# Patient Record
Sex: Female | Born: 1960 | Hispanic: No | Marital: Married | Smoking: Never smoker
Health system: Southern US, Community
[De-identification: ages and names within clinical notes are randomized; demographics above are authoritative.]

## PROBLEM LIST (undated history)

## (undated) DIAGNOSIS — M1990 Unspecified osteoarthritis, unspecified site: Secondary | ICD-10-CM

## (undated) HISTORY — PX: KNEE SURGERY: SHX244

## (undated) HISTORY — DX: Unspecified osteoarthritis, unspecified site: M19.90

---

## 2005-12-26 ENCOUNTER — Ambulatory Visit: Payer: Self-pay | Admitting: General Practice

## 2007-01-01 ENCOUNTER — Ambulatory Visit: Payer: Self-pay | Admitting: General Practice

## 2008-03-12 HISTORY — PX: OTHER SURGICAL HISTORY: SHX169

## 2011-12-18 ENCOUNTER — Ambulatory Visit (INDEPENDENT_AMBULATORY_CARE_PROVIDER_SITE_OTHER): Payer: BC Managed Care – PPO | Admitting: Physician Assistant

## 2011-12-18 VITALS — BP 138/75 | HR 59 | Temp 98.5°F | Resp 16 | Ht 66.0 in | Wt 143.0 lb

## 2011-12-18 DIAGNOSIS — Z124 Encounter for screening for malignant neoplasm of cervix: Secondary | ICD-10-CM

## 2011-12-18 DIAGNOSIS — Z1239 Encounter for other screening for malignant neoplasm of breast: Secondary | ICD-10-CM

## 2011-12-18 DIAGNOSIS — Z0000 Encounter for general adult medical examination without abnormal findings: Secondary | ICD-10-CM

## 2011-12-18 LAB — POCT URINALYSIS DIPSTICK
Bilirubin, UA: NEGATIVE
Glucose, UA: NEGATIVE
Spec Grav, UA: 1.015
Urobilinogen, UA: 0.2

## 2011-12-18 LAB — POCT CBC
Granulocyte percent: 54.4 %G (ref 37–80)
HCT, POC: 41.1 % (ref 37.7–47.9)
Hemoglobin: 13 g/dL (ref 12.2–16.2)
MCV: 96.3 fL (ref 80–97)
MID (cbc): 0.7 (ref 0–0.9)
Platelet Count, POC: 306 10*3/uL (ref 142–424)
RBC: 4.27 M/uL (ref 4.04–5.48)

## 2011-12-18 NOTE — Progress Notes (Signed)
Subjective:    Patient ID: Patty Martinez, female    DOB: [birth year], 51 y.o.   MRN: [id number]  HPI  Patty Martinez is a 51 yr old female here for CPE/pap/pelvic.  She speaks limited english, and her son is helping to translate.  She has residual shoulder pain from an injury several years ago, but other than that no specific complaints today.  She would like routine lab work, a pap test, and referral for mammogram as her insurance will be lapsing this month.  She has been on worker's comp from her shoulder injury but will be laid off this month because they are unable to find her a new position.  She had an eye doctor appointment last week and had her dental check-up a couple months ago.  Her last pap was 2-3 years ago, so she would like to do this today.  She has no concern for STIs and does not want to be tested today.  She has not had a flu shot and does not want one.     Review of Systems  Constitutional: Negative for fever and chills.  HENT: Negative.   Eyes: Negative.   Respiratory: Negative.   Cardiovascular: Negative.   Gastrointestinal: Negative.   Genitourinary: Negative.   Musculoskeletal: Positive for arthralgias (Right shoulder).  Neurological: Positive for dizziness (Sometimes with sinus pain). Negative for syncope, light-headedness and headaches.  Hematological: Does not bruise/bleed easily.       Objective:   Physical Exam  Constitutional: She is oriented to person, place, and time. She appears well-developed and well-nourished. No distress.  HENT:  Right Ear: Hearing, tympanic membrane, external ear and ear canal normal.  Left Ear: Hearing, tympanic membrane, external ear and ear canal normal.  Nose: Nose normal.  Mouth/Throat: Uvula is midline, oropharynx is clear and moist and mucous membranes are normal.  Eyes: Conjunctivae normal are normal. Pupils are equal, round, and reactive to light.  Neck: Neck supple. No thyromegaly present.  Cardiovascular: Normal rate,  regular rhythm, normal heart sounds and intact distal pulses.  Exam reveals no gallop and no friction rub.   No murmur heard. Pulmonary/Chest: Breath sounds normal. She has no wheezes. She has no rales.  Abdominal: Soft. Bowel sounds are normal. There is no tenderness. There is no rebound and no guarding.  Genitourinary: Vagina normal and uterus normal. No vaginal discharge found.       No cervical motion tenderness or adnexal tenderness, no adnexal masses; breast exam normal  Lymphadenopathy:    She has no cervical adenopathy.  Neurological: She is alert and oriented to person, place, and time. She has normal reflexes.  Skin: Skin is warm and dry.  Psychiatric: She has a normal mood and affect.    Results for orders placed in visit on 12/18/11  POCT CBC      Component Value Range   WBC 8.0  4.6 - 10.2 K/uL   Lymph, poc 2.9  0.6 - 3.4   POC LYMPH PERCENT 36.5  10 - 50 %L   MID (cbc) 0.7  0 - 0.9   POC MID % 9.1  0 - 12 %M   POC Granulocyte 4.4  2 - 6.9   Granulocyte percent 54.4  37 - 80 %G   RBC 4.27  4.04 - 5.48 M/uL   Hemoglobin 13.0  12.2 - 16.2 g/dL   HCT, POC 29.5  62.1 - 47.9 %   MCV 96.3  80 - 97 fL   MCH, POC 30.4  27 - 31.2 pg   MCHC 31.6 (*) 31.8 - 35.4 g/dL   RDW, POC 16.1     Platelet Count, POC 306  142 - 424 K/uL   MPV 8.0  0 - 99.8 fL  POCT URINALYSIS DIPSTICK      Component Value Range   Color, UA yellow     Clarity, UA clear     Glucose, UA neg     Bilirubin, UA neg     Ketones, UA neg     Spec Grav, UA 1.015     Blood, UA small     pH, UA 7.0     Protein, UA neg     Urobilinogen, UA 0.2     Nitrite, UA neg     Leukocytes, UA Negative           Assessment & Plan:  Patty Martinez is a 51 yr old female here for CPE, pap, and pelvic.  She is well appearing and has no complaints.  CBC and UA are normal.   CMET, TSH, lipids, and pap are pending.  Made referral for screening mammogram.  She will return to clinic as needed

## 2011-12-19 LAB — LIPID PANEL
Cholesterol: 237 mg/dL — ABNORMAL HIGH (ref 0–200)
LDL Cholesterol: 162 mg/dL — ABNORMAL HIGH (ref 0–99)
VLDL: 19 mg/dL (ref 0–40)

## 2011-12-19 LAB — COMPREHENSIVE METABOLIC PANEL
AST: 17 U/L (ref 0–37)
Albumin: 4.6 g/dL (ref 3.5–5.2)
Alkaline Phosphatase: 36 U/L — ABNORMAL LOW (ref 39–117)
Glucose, Bld: 86 mg/dL (ref 70–99)
Potassium: 4 mEq/L (ref 3.5–5.3)
Sodium: 139 mEq/L (ref 135–145)
Total Protein: 7.6 g/dL (ref 6.0–8.3)

## 2011-12-19 NOTE — Progress Notes (Signed)
Reviewed and agree.

## 2011-12-21 ENCOUNTER — Ambulatory Visit
Admission: RE | Admit: 2011-12-21 | Discharge: 2011-12-21 | Disposition: A | Discharge status: Home / Self Care | Payer: BC Managed Care – PPO | Source: Ambulatory Visit | Attending: Physician Assistant | Admitting: Physician Assistant

## 2011-12-21 DIAGNOSIS — Z1239 Encounter for other screening for malignant neoplasm of breast: Secondary | ICD-10-CM

## 2011-12-21 LAB — PAP IG W/ RFLX HPV ASCU

## 2011-12-24 ENCOUNTER — Telehealth: Payer: Self-pay | Admitting: Radiology

## 2011-12-24 DIAGNOSIS — R928 Other abnormal and inconclusive findings on diagnostic imaging of breast: Secondary | ICD-10-CM

## 2011-12-24 NOTE — Telephone Encounter (Signed)
Thank you :)

## 2011-12-24 NOTE — Telephone Encounter (Signed)
I called the breast center to inquire about the mammogram, if she was scheduled for additional views. They need order for [place] and diagnostic Mammogram . This is put in the system for her. I have called patient to advise of need for extra views.

## 2011-12-25 ENCOUNTER — Ambulatory Visit
Admission: RE | Admit: 2011-12-25 | Discharge: 2011-12-25 | Disposition: A | Discharge status: Home / Self Care | Payer: BC Managed Care – PPO | Source: Ambulatory Visit | Attending: Physician Assistant | Admitting: Physician Assistant

## 2011-12-25 DIAGNOSIS — R928 Other abnormal and inconclusive findings on diagnostic imaging of breast: Secondary | ICD-10-CM

## 2012-04-26 ENCOUNTER — Other Ambulatory Visit: Payer: Self-pay

## 2012-06-26 ENCOUNTER — Ambulatory Visit (INDEPENDENT_AMBULATORY_CARE_PROVIDER_SITE_OTHER): Payer: PRIVATE HEALTH INSURANCE | Admitting: Family Medicine

## 2012-06-26 VITALS — BP 100/62 | HR 70 | Temp 98.1°F | Resp 18 | Ht 66.0 in | Wt 149.0 lb

## 2012-06-26 DIAGNOSIS — E785 Hyperlipidemia, unspecified: Secondary | ICD-10-CM

## 2012-06-26 LAB — LIPID PANEL
Cholesterol: 241 mg/dL — ABNORMAL HIGH (ref 0–200)
LDL Cholesterol: 166 mg/dL — ABNORMAL HIGH (ref 0–99)
Total CHOL/HDL Ratio: 4 Ratio
VLDL: 14 mg/dL (ref 0–40)

## 2012-06-26 NOTE — Patient Instructions (Signed)
We will let you know the results of your tests ?

## 2012-06-26 NOTE — Progress Notes (Signed)
Subjective: 52 year old lady who is here to get her lipids rechecked. Last fall when she had them done her cholesterol was moderately high. She has had a couple of injuries in life, including injury to her right shoulder and right knee. She he has limited still somewhat in-home ability, walking with a crutch. She does try to take care of herself. No other major health issues or concerns. She does not smoke. She does not have high blood pressure. She is not diabetic. Her risk factors are fairly low, except for this elevation of  Lipids.  Objective: Chest clear. Heart regular without murmurs.  Assessment: Hyperlipidemia  Plan: Lipid panel

## 2012-06-30 ENCOUNTER — Other Ambulatory Visit: Payer: Self-pay | Admitting: Family Medicine

## 2012-06-30 DIAGNOSIS — E785 Hyperlipidemia, unspecified: Secondary | ICD-10-CM

## 2012-06-30 MED ORDER — PRAVASTATIN SODIUM 20 MG PO TABS: 20.0000 mg | ORAL_TABLET | Freq: Every day | ORAL | Status: DC

## 2012-11-21 ENCOUNTER — Ambulatory Visit (INDEPENDENT_AMBULATORY_CARE_PROVIDER_SITE_OTHER): Payer: PRIVATE HEALTH INSURANCE | Admitting: Family Medicine

## 2012-11-21 VITALS — BP 118/72 | HR 65 | Temp 98.0°F | Resp 17 | Ht 67.5 in | Wt 142.0 lb

## 2012-11-21 DIAGNOSIS — E785 Hyperlipidemia, unspecified: Secondary | ICD-10-CM

## 2012-11-21 LAB — COMPREHENSIVE METABOLIC PANEL
ALT: 13 U/L (ref 0–35)
AST: 18 U/L (ref 0–37)
Albumin: 4.6 g/dL (ref 3.5–5.2)
Alkaline Phosphatase: 43 U/L (ref 39–117)
BUN: 11 mg/dL (ref 6–23)
Calcium: 9.1 mg/dL (ref 8.4–10.5)
Chloride: 105 mEq/L (ref 96–112)
Potassium: 4.5 mEq/L (ref 3.5–5.3)
Sodium: 137 mEq/L (ref 135–145)
Total Protein: 7.5 g/dL (ref 6.0–8.3)

## 2012-11-21 LAB — LIPID PANEL: LDL Cholesterol: 109 mg/dL — ABNORMAL HIGH (ref 0–99)

## 2012-11-21 MED ORDER — PRAVASTATIN SODIUM 20 MG PO TABS: 20.0000 mg | ORAL_TABLET | Freq: Every day | ORAL | Status: DC

## 2012-11-21 NOTE — Patient Instructions (Addendum)
Continue same medications for now until labs return. If the labs are satisfactory will see her back in 6 months.

## 2012-11-21 NOTE — Progress Notes (Signed)
Subjective: 52 year old lady who is here for a regular visit with regard to her high cholesterol. Last spring she was placed on Pravachol for the high cholesterol. The LDL was very high. She feels fine, has not been having any problems from the medication. She does do some walking, however she had a torn meniscus in her knee so she does not get a lot of exercise. She's not employed at this time. She does not smoke. Her mother is living and has hypertension. Her father died in his 21s from cancer. No other major diseases.  Objective: Pleasant lady in no acute distress. Neck supple without nodes thyromegaly. Chest clear. Heart regular without murmurs. Abdomen soft without hepatosplenomegaly.  Assessment: Hyperlipidemia  Plan: Check lipids,cmet  Return in 6 months. Continue same medication.

## 2012-11-24 ENCOUNTER — Encounter: Payer: Self-pay | Admitting: Family Medicine

## 2013-01-13 ENCOUNTER — Other Ambulatory Visit: Payer: Self-pay

## 2013-01-13 DIAGNOSIS — Z1231 Encounter for screening mammogram for malignant neoplasm of breast: Secondary | ICD-10-CM

## 2013-01-15 ENCOUNTER — Other Ambulatory Visit: Payer: Self-pay

## 2013-01-15 ENCOUNTER — Ambulatory Visit
Admission: RE | Admit: 2013-01-15 | Discharge: 2013-01-15 | Disposition: A | Discharge status: Home / Self Care | Payer: PRIVATE HEALTH INSURANCE | Source: Ambulatory Visit

## 2013-01-15 DIAGNOSIS — Z1231 Encounter for screening mammogram for malignant neoplasm of breast: Secondary | ICD-10-CM

## 2013-05-13 ENCOUNTER — Ambulatory Visit (INDEPENDENT_AMBULATORY_CARE_PROVIDER_SITE_OTHER): Payer: PRIVATE HEALTH INSURANCE | Admitting: Physician Assistant

## 2013-05-13 VITALS — BP 128/80 | HR 60 | Temp 97.5°F | Resp 16 | Ht 67.0 in | Wt 150.0 lb

## 2013-05-13 DIAGNOSIS — E785 Hyperlipidemia, unspecified: Secondary | ICD-10-CM

## 2013-05-13 LAB — LIPID PANEL
CHOL/HDL RATIO: 3.2 ratio
Cholesterol: 221 mg/dL — ABNORMAL HIGH (ref 0–200)
HDL: 69 mg/dL (ref >39)
LDL CALC: 136 mg/dL — AB (ref 0–99)
Triglycerides: 81 mg/dL (ref <150)
VLDL: 16 mg/dL (ref 0–40)

## 2013-05-13 MED ORDER — PRAVASTATIN SODIUM 20 MG PO TABS: 20.0000 mg | ORAL_TABLET | Freq: Every day | ORAL | Status: DC

## 2013-05-13 NOTE — Addendum Note (Signed)
Addended by: Godfrey PickEGAN, Ester Hilley E on: 05/13/2013 04:51 PM   Modules accepted: Orders

## 2013-05-13 NOTE — Patient Instructions (Signed)
I will let you know when results are back, and I will send medication at that time.  Make sure you are eating plenty of vegetables, fruits, and whole grains  Try to incorporate exercise every day, even if it's just walking  Keep up the good work!   Cholesterol Cholesterol is a white, waxy, fat-like protein needed by your body in small amounts. The liver makes all the cholesterol you need. It is carried from the liver by the blood through the blood vessels. Deposits (plaque) may build up on blood vessel walls. This makes the arteries narrower and stiffer. Plaque increases the risk for heart attack and stroke. You cannot feel your cholesterol level even if it is very high. The only way to know is by a blood test to check your lipid (fats) levels. Once you know your cholesterol levels, you should keep a record of the test results. Work with your caregiver to to keep your levels in the desired range. WHAT THE RESULTS MEAN:  Total cholesterol is a rough measure of all the cholesterol in your blood.  LDL is the so-called bad cholesterol. This is the type that deposits cholesterol in the walls of the arteries. You want this level to be low.  HDL is the good cholesterol because it cleans the arteries and carries the LDL away. You want this level to be high.  Triglycerides are fat that the body can either burn for energy or store. High levels are closely linked to heart disease. DESIRED LEVELS:  Total cholesterol below 200.  LDL below 100 for people at risk, below 70 for very high risk.  HDL above 50 is good, above 60 is best.  Triglycerides below 150. HOW TO LOWER YOUR CHOLESTEROL:  Diet.  Choose fish or white meat chicken and [place]key, roasted or baked. Limit fatty cuts of red meat, fried foods, and processed meats, such as sausage and lunch meat.  Eat lots of fresh fruits and vegetables. Choose whole grains, beans, pasta, potatoes and cereals.  Use only small amounts of olive, corn or  canola oils. Avoid butter, mayonnaise, shortening or palm kernel oils. Avoid foods with trans-fats.  Use skim/nonfat milk and low-fat/nonfat yogurt and cheeses. Avoid whole milk, cream, ice cream, egg yolks and cheeses. Healthy desserts include angel food cake, ginger snaps, animal crackers, hard candy, popsicles, and low-fat/nonfat frozen yogurt. Avoid pastries, cakes, pies and cookies.  Exercise.  A regular program helps decrease LDL and raises HDL.  Helps with weight control.  Do things that increase your activity level like gardening, walking, or taking the stairs.  Medication.  May be prescribed by your caregiver to help lowering cholesterol and the risk for heart disease.  You may need medicine even if your levels are normal if you have several risk factors. HOME CARE INSTRUCTIONS   Follow your diet and exercise programs as suggested by your caregiver.  Take medications as directed.  Have blood work done when your caregiver feels it is necessary. MAKE SURE YOU:   Understand these instructions.  Will watch your condition.  Will get help right away if you are not doing well or get worse. Document Released: 11/21/2000 Document Revised: 05/21/2011 Document Reviewed: 12/10/2012 [facility] Patient Information 2014 [place], [place].

## 2013-05-13 NOTE — Progress Notes (Signed)
   Subjective:    Patient ID: Laqueta Dueosa Ohlinger, female    DOB: 1960/09/04, 53 y.o.   MRN: [id number]  HPI   Ms. Burklow is a very pleasant 53 yr old female here for fasting labs and refills on cholesterol medicine.  Last checked 6 months ago.  Has been on the medicine about 1 yr.  Takes pravastatin 20mg  daily and tolerates it well.  She eats a varied diet.  Gets a little bit of exercise but not regularly.  She is fasting today.  She feels well today.     Review of Systems  Constitutional: Negative.   HENT: Negative.   Respiratory: Negative.   Cardiovascular: Negative.   Gastrointestinal: Negative.   Musculoskeletal: Negative.   Skin: Negative.        Objective:   Physical Exam  Vitals reviewed. Constitutional: She is oriented to person, place, and time. She appears well-developed and well-nourished. No distress.  HENT:  Head: Normocephalic and atraumatic.  Eyes: Conjunctivae are normal. No scleral icterus.  Cardiovascular: Normal rate, regular rhythm and normal heart sounds.   Pulmonary/Chest: Effort normal and breath sounds normal. She has no wheezes. She has no rales.  Neurological: She is alert and oriented to person, place, and time.  Skin: Skin is warm and dry.  Psychiatric: She has a normal mood and affect. Her behavior is normal.        Assessment & Plan:  Hyperlipidemia - Plan: Lipid panel   Ms. Kovarik is a very pleasant 53 yr old female here for fasting lipid panel.  Stable on pravastatin 20mg  for the last year.  FLP sent.  Will send meds once we have results.  Encouraged lifestyle mods as well.    Pt to call or RTC if worsening or not improving  E. Frances FurbishElizabeth Callen Vancuren MHS, PA-C [facility] 3/4/20158:12 AM

## 2013-06-15 ENCOUNTER — Ambulatory Visit (INDEPENDENT_AMBULATORY_CARE_PROVIDER_SITE_OTHER): Payer: PRIVATE HEALTH INSURANCE | Admitting: Physician Assistant

## 2013-06-15 VITALS — BP 124/70 | HR 62 | Temp 98.2°F | Resp 16 | Ht 66.5 in | Wt 153.0 lb

## 2013-06-15 DIAGNOSIS — B373 Candidiasis of vulva and vagina: Secondary | ICD-10-CM

## 2013-06-15 DIAGNOSIS — N898 Other specified noninflammatory disorders of vagina: Secondary | ICD-10-CM

## 2013-06-15 DIAGNOSIS — N760 Acute vaginitis: Secondary | ICD-10-CM

## 2013-06-15 DIAGNOSIS — B3731 Acute candidiasis of vulva and vagina: Secondary | ICD-10-CM

## 2013-06-15 DIAGNOSIS — L293 Anogenital pruritus, unspecified: Secondary | ICD-10-CM

## 2013-06-15 DIAGNOSIS — B9689 Other specified bacterial agents as the cause of diseases classified elsewhere: Secondary | ICD-10-CM

## 2013-06-15 DIAGNOSIS — R300 Dysuria: Secondary | ICD-10-CM

## 2013-06-15 LAB — POCT URINALYSIS DIPSTICK
Bilirubin, UA: NEGATIVE
Glucose, UA: NEGATIVE
Ketones, UA: NEGATIVE
Nitrite, UA: NEGATIVE
PH UA: 5.5
PROTEIN UA: NEGATIVE
SPEC GRAV UA: 1.015
UROBILINOGEN UA: 0.2

## 2013-06-15 LAB — POCT UA - MICROSCOPIC ONLY
Casts, Ur, LPF, POC: NEGATIVE
Crystals, Ur, HPF, POC: NEGATIVE
Mucus, UA: NEGATIVE
Yeast, UA: NEGATIVE

## 2013-06-15 LAB — POCT WET PREP WITH KOH
KOH PREP POC: POSITIVE
Trichomonas, UA: NEGATIVE
Yeast Wet Prep HPF POC: NEGATIVE

## 2013-06-15 MED ORDER — FLUCONAZOLE 150 MG PO TABS: 150.0000 mg | ORAL_TABLET | Freq: Once | ORAL | Status: DC

## 2013-06-15 MED ORDER — METRONIDAZOLE 500 MG PO TABS: 500.0000 mg | ORAL_TABLET | Freq: Two times a day (BID) | ORAL | Status: DC

## 2013-06-15 NOTE — Progress Notes (Signed)
Subjective:    Patient ID: Patty Martinez, female    DOB: April 03, 1960, 53 y.o.   MRN: [id number]  HPI   Patty Martinez is a very pleasant 53 yr old female here with concern for illness.  She reports a couple days of burning and itching with urination.  She has external irritation.  She experiences discomfort if sitting for a long time.  She denies abd pain or flank pain.  She denies FC, NV.  She denies freq, urg, or hematuria.  She denies vaginal discharge.  She tried some otc wipes without relief.  She is monogamous with her husband, no concern for STI.  LMP Oct 2014   Review of Systems  Constitutional: Negative for fever and chills.  Respiratory: Negative.   Cardiovascular: Negative.   Genitourinary: Positive for dysuria. Negative for urgency, frequency, hematuria, flank pain and vaginal discharge.  Musculoskeletal: Negative.   Skin: Negative.        Objective:   Physical Exam  Vitals reviewed. Constitutional: She is oriented to person, place, and time. She appears well-developed and well-nourished. No distress.  HENT:  Head: Normocephalic and atraumatic.  Eyes: Conjunctivae are normal. No scleral icterus.  Cardiovascular: Normal rate, regular rhythm and normal heart sounds.   Pulmonary/Chest: Effort normal. She has no wheezes. She has no rales.  Abdominal: Soft. Bowel sounds are normal. There is no tenderness. There is no CVA tenderness.  Genitourinary: Uterus normal. There is no rash, tenderness or lesion on the right labia. There is no rash, tenderness or lesion on the left labia. Cervix exhibits no motion tenderness. Right adnexum displays no mass, no tenderness and no fullness. Left adnexum displays no mass, no tenderness and no fullness. Vaginal discharge (moderate, thick white discharge) found.  Neurological: She is alert and oriented to person, place, and time.  Skin: Skin is warm and dry.  Psychiatric: She has a normal mood and affect. Her behavior is normal.   Results for  orders placed in visit on 06/15/13  POCT UA - MICROSCOPIC ONLY      Result Value Ref Range   WBC, Ur, HPF, POC 8-15     RBC, urine, microscopic 0-3     Bacteria, U Microscopic 1+     Mucus, UA neg     Epithelial cells, urine per micros 6-10     Crystals, Ur, HPF, POC neg     Casts, Ur, LPF, POC neg     Yeast, UA neg    POCT URINALYSIS DIPSTICK      Result Value Ref Range   Color, UA yellow     Clarity, UA cloudy     Glucose, UA neg     Bilirubin, UA neg     Ketones, UA neg     Spec Grav, UA 1.015     Blood, UA moderate     pH, UA 5.5     Protein, UA neg     Urobilinogen, UA 0.2     Nitrite, UA neg     Leukocytes, UA small (1+)    POCT WET PREP WITH KOH      Result Value Ref Range   Trichomonas, UA Negative     Clue Cells Wet Prep HPF POC 3-5     Epithelial Wet Prep HPF POC 6-10     Yeast Wet Prep HPF POC neg     Bacteria Wet Prep HPF POC 2+     RBC Wet Prep HPF POC 0-2     WBC  Wet Prep HPF POC 10-20     KOH Prep POC Positive          Assessment & Plan:  Vaginal candidiasis - Plan: fluconazole (DIFLUCAN) 150 MG tablet  Bacterial vaginosis - Plan: metroNIDAZOLE (FLAGYL) 500 MG tablet  Burning with urination - Plan: POCT UA - Microscopic Only, POCT urinalysis dipstick, POCT Wet Prep with KOH, Urine culture  Vaginal itching - Plan: POCT Wet Prep with KOH   Patty Martinez is a very pleasant 53 yr old female with vaginal candidiasis and bacterial vaginosis.  Will treat with Diflucan and Flagyl.  She has small leuks on UA but suspect that this is contamination.  Will send urine cx to completely r/o UTI.    Pt to call or RTC if worsening or not improving  E. Frances Furbish MHS, PA-C [facility] 4/6/20156:07 PM

## 2013-06-15 NOTE — Patient Instructions (Signed)
Take one fluconazole (Diflucan) pill today - this will treat the yeast infection  Take the metronidazole (Flagyl) twice daily for 7 days- this will treat the bacterial vaginosis  1-2 days after you finish the Flagyl, take the other Diflucan  If any symptoms are worsening or not improving, please let me know  I will let you know when your urine culture results are back - I do not think you have infection in your bladder, but this will confirm that.  If you do have infection in your bladder, I will send another medicine   Bacterial Vaginosis Bacterial vaginosis is a vaginal infection that occurs when the normal balance of bacteria in the vagina is disrupted. It results from an overgrowth of certain bacteria. This is the most common vaginal infection in women of childbearing age. Treatment is important to prevent complications, especially in pregnant women, as it can cause a premature delivery. CAUSES  Bacterial vaginosis is caused by an increase in harmful bacteria that are normally present in smaller amounts in the vagina. Several different kinds of bacteria can cause bacterial vaginosis. However, the reason that the condition develops is not fully understood. RISK FACTORS Certain activities or behaviors can put you at an increased risk of developing bacterial vaginosis, including:  Having a new sex partner or multiple sex partners.  Douching.  Using an intrauterine device (IUD) for contraception. Women do not get bacterial vaginosis from toilet seats, bedding, swimming pools, or contact with objects around them. SIGNS AND SYMPTOMS  Some women with bacterial vaginosis have no signs or symptoms. Common symptoms include:  Grey vaginal discharge.  A fishlike odor with discharge, especially after sexual intercourse.  Itching or burning of the vagina and vulva.  Burning or pain with urination. DIAGNOSIS  Your health care provider will take a medical history and examine the vagina for  signs of bacterial vaginosis. A sample of vaginal fluid may be taken. Your health care provider will look at this sample under a microscope to check for bacteria and abnormal cells. A vaginal pH test may also be done.  TREATMENT  Bacterial vaginosis may be treated with antibiotic medicines. These may be given in the form of a pill or a vaginal cream. A second round of antibiotics may be prescribed if the condition comes back after treatment.  HOME CARE INSTRUCTIONS   Only take over-the-counter or prescription medicines as directed by your health care provider.  If antibiotic medicine was prescribed, take it as directed. Make sure you finish it even if you start to feel better.  Do not have sex until treatment is completed.  Tell all sexual partners that you have a vaginal infection. They should see their health care provider and be treated if they have problems, such as a mild rash or itching.  Practice safe sex by using condoms and only having one sex partner. SEEK MEDICAL CARE IF:   Your symptoms are not improving after 3 days of treatment.  You have increased discharge or pain.  You have a fever. MAKE SURE YOU:   Understand these instructions.  Will watch your condition.  Will get help right away if you are not doing well or get worse. FOR MORE INFORMATION  Centers for Disease Control and Prevention, Division of STD Prevention: SolutionApps.co.zawww.cdc.gov/std American Sexual Health Association (ASHA): www.ashastd.org  Document Released: 02/26/2005 Document Revised: 12/17/2012 Document Reviewed: 10/08/2012 [facility] Patient Information 2014 [place]ExitCare, [place].   Monilial Vaginitis Vaginitis in a soreness, swelling and redness (inflammation) of the vagina  and vulva. Monilial vaginitis is not a sexually transmitted infection. CAUSES  Yeast vaginitis is caused by yeast (candida) that is normally found in your vagina. With a yeast infection, the candida has overgrown in number to a point that upsets  the chemical balance. SYMPTOMS   White, thick vaginal discharge.  Swelling, itching, redness and irritation of the vagina and possibly the lips of the vagina (vulva).  Burning or painful urination.  Painful intercourse. DIAGNOSIS  Things that may contribute to monilial vaginitis are:  Postmenopausal and virginal states.  Pregnancy.  Infections.  Being tired, sick or stressed, especially if you had monilial vaginitis in the past.  Diabetes. Good control will help lower the chance.  Birth control pills.  Tight fitting garments.  Using bubble bath, feminine sprays, douches or deodorant tampons.  Taking certain medications that kill germs (antibiotics).  Sporadic recurrence can occur if you become ill. TREATMENT  Your caregiver will give you medication.  There are several kinds of anti monilial vaginal creams and suppositories specific for monilial vaginitis. For recurrent yeast infections, use a suppository or cream in the vagina 2 times a week, or as directed.  Anti-monilial or steroid cream for the itching or irritation of the vulva may also be used. Get your caregiver's permission.  Painting the vagina with methylene blue solution may help if the monilial cream does not work.  Eating yogurt may help prevent monilial vaginitis. HOME CARE INSTRUCTIONS   Finish all medication as prescribed.  Do not have sex until treatment is completed or after your caregiver tells you it is okay.  Take warm sitz baths.  Do not douche.  Do not use tampons, especially scented ones.  Wear cotton underwear.  Avoid tight pants and panty hose.  Tell your sexual partner that you have a yeast infection. They should go to their caregiver if they have symptoms such as mild rash or itching.  Your sexual partner should be treated as well if your infection is difficult to eliminate.  Practice safer sex. Use condoms.  Some vaginal medications cause latex condoms to fail. Vaginal  medications that harm condoms are:  Cleocin cream.  Butoconazole (Femstat).  Terconazole (Terazol) vaginal suppository.  Miconazole (Monistat) (may be purchased over the counter). SEEK MEDICAL CARE IF:   You have a temperature by mouth above 102 F (38.9 C).  The infection is getting worse after 2 days of treatment.  The infection is not getting better after 3 days of treatment.  You develop blisters in or around your vagina.  You develop vaginal bleeding, and it is not your menstrual period.  You have pain when you urinate.  You develop intestinal problems.  You have pain with sexual intercourse. Document Released: 12/06/2004 Document Revised: 05/21/2011 Document Reviewed: 08/20/2008 [facility] Patient Information 2014 [place], [place].

## 2013-06-16 LAB — URINE CULTURE
Colony Count: NO GROWTH
Organism ID, Bacteria: NO GROWTH

## 2013-11-17 ENCOUNTER — Ambulatory Visit (INDEPENDENT_AMBULATORY_CARE_PROVIDER_SITE_OTHER): Payer: PRIVATE HEALTH INSURANCE | Admitting: Family Medicine

## 2013-11-17 VITALS — BP 122/78 | HR 61 | Temp 97.5°F | Resp 16 | Ht 66.0 in | Wt 147.2 lb

## 2013-11-17 DIAGNOSIS — M25511 Pain in right shoulder: Secondary | ICD-10-CM

## 2013-11-17 DIAGNOSIS — Z23 Encounter for immunization: Secondary | ICD-10-CM

## 2013-11-17 DIAGNOSIS — M545 Low back pain, unspecified: Secondary | ICD-10-CM

## 2013-11-17 DIAGNOSIS — E785 Hyperlipidemia, unspecified: Secondary | ICD-10-CM

## 2013-11-17 DIAGNOSIS — M25519 Pain in unspecified shoulder: Secondary | ICD-10-CM

## 2013-11-17 LAB — COMPREHENSIVE METABOLIC PANEL
ALBUMIN: 4.8 g/dL (ref 3.5–5.2)
ALT: 33 U/L (ref 0–35)
AST: 28 U/L (ref 0–37)
Alkaline Phosphatase: 53 U/L (ref 39–117)
BILIRUBIN TOTAL: 0.5 mg/dL (ref 0.2–1.2)
BUN: 10 mg/dL (ref 6–23)
CO2: 27 mEq/L (ref 19–32)
Calcium: 9.8 mg/dL (ref 8.4–10.5)
Chloride: 104 mEq/L (ref 96–112)
Creat: 0.71 mg/dL (ref 0.50–1.10)
Glucose, Bld: 99 mg/dL (ref 70–99)
POTASSIUM: 4.7 meq/L (ref 3.5–5.3)
SODIUM: 139 meq/L (ref 135–145)
TOTAL PROTEIN: 7.8 g/dL (ref 6.0–8.3)

## 2013-11-17 LAB — LIPID PANEL
Cholesterol: 224 mg/dL — ABNORMAL HIGH (ref 0–200)
HDL: 60 mg/dL (ref >39)
LDL Cholesterol: 142 mg/dL — ABNORMAL HIGH (ref 0–99)
Total CHOL/HDL Ratio: 3.7 Ratio
Triglycerides: 112 mg/dL (ref <150)
VLDL: 22 mg/dL (ref 0–40)

## 2013-11-17 LAB — POCT CBC
GRANULOCYTE PERCENT: 48.6 % (ref 37–80)
HCT, POC: 41.4 % (ref 37.7–47.9)
Hemoglobin: 13.5 g/dL (ref 12.2–16.2)
Lymph, poc: 2.3 (ref 0.6–3.4)
MCH, POC: 30.8 pg (ref 27–31.2)
MCHC: 32.7 g/dL (ref 31.8–35.4)
MCV: 94.4 fL (ref 80–97)
MID (CBC): 0.5 (ref 0–0.9)
MPV: 6.8 fL (ref 0–99.8)
POC GRANULOCYTE: 2.7 (ref 2–6.9)
POC LYMPH %: 41.6 % (ref 10–50)
POC MID %: 9.8 %M (ref 0–12)
Platelet Count, POC: 284 10*3/uL (ref 142–424)
RBC: 4.39 M/uL (ref 4.04–5.48)
RDW, POC: 13.9 %
WBC: 5.6 10*3/uL (ref 4.6–10.2)

## 2013-11-17 MED ORDER — PRAVASTATIN SODIUM 20 MG PO TABS: 20.0000 mg | ORAL_TABLET | Freq: Every day | ORAL | Status: DC

## 2013-11-17 NOTE — Patient Instructions (Addendum)
Continue current medication  I will let you know the results of your labs  Return in 6 months unless we instructed otherwise.  Tetanus shot will be given today  Consider getting a flu shot in the near future

## 2013-11-17 NOTE — Progress Notes (Signed)
Subjective: 53 year old lady previously known to me. She has hyperlipidemia and is here for a recheck of her labs. She feels fairly well. She has been on disability because she is having problems with right shoulder pain and right neck pain. She also complains of a little bit of low back pain down in the sacral area when she sits for long time. She's not currently. She feels well otherwise. No major headaches or dizziness no chest pains or shortness of breath or stomach complaints. She does not smoke or drink. She does not get a lot of exercise.  Objective: Pleasant lady in no acute distress. She has a lazy on the right with essentially no vision from it. Her TMs are normal. Throat clear. Fundi benign on left. Cannot well visualize right. Neck supple without significant nodes. Chest clear. Heart regular without murmurs. Abdomen soft without mass or tenderness. Extremities without edema. She has mild tenderness at the mid sacral. Good range of motion.  Assessment: Hyperlipidemia Low back pain History of right shoulder pain  Plan: The current medication Check CBC, metabolic panel, and lipids. I will let her know the results of her labs in a few days but in the meanwhile she is to continue same medications. She was urged to stretch regularly when she's been seated for long time.  She has been many years without tetanus vaccination and has requested vaccination today.  Last time that she got a flu shot she got a bad viral illness. She is reluctant to stick the flu shot. Her symptoms really sound like she got sick of other things at the time she got the flu shot. I have urged her to reconsider taking it, but not taken today one beer during her the tetanus shot. She can come back for the flu shot at anytime again elsewhere.

## 2013-11-19 ENCOUNTER — Other Ambulatory Visit: Payer: Self-pay | Admitting: *Deleted

## 2013-11-19 DIAGNOSIS — E782 Mixed hyperlipidemia: Secondary | ICD-10-CM

## 2013-11-19 MED ORDER — PRAVASTATIN SODIUM 40 MG PO TABS: 40.0000 mg | ORAL_TABLET | Freq: Every day | ORAL | Status: DC

## 2013-12-03 ENCOUNTER — Ambulatory Visit (INDEPENDENT_AMBULATORY_CARE_PROVIDER_SITE_OTHER): Payer: PRIVATE HEALTH INSURANCE | Admitting: Emergency Medicine

## 2013-12-03 VITALS — BP 145/80 | HR 64 | Temp 97.8°F | Resp 16 | Ht 66.0 in | Wt 145.0 lb

## 2013-12-03 DIAGNOSIS — J018 Other acute sinusitis: Secondary | ICD-10-CM

## 2013-12-03 DIAGNOSIS — J209 Acute bronchitis, unspecified: Secondary | ICD-10-CM

## 2013-12-03 DIAGNOSIS — Z23 Encounter for immunization: Secondary | ICD-10-CM

## 2013-12-03 MED ORDER — AMOXICILLIN-POT CLAVULANATE 875-125 MG PO TABS: 1.0000 | ORAL_TABLET | Freq: Two times a day (BID) | ORAL | Status: DC

## 2013-12-03 MED ORDER — PROMETHAZINE-CODEINE 6.25-10 MG/5ML PO SYRP: 5.0000 mL | ORAL_SOLUTION | ORAL | Status: DC | PRN

## 2013-12-03 MED ORDER — PSEUDOEPHEDRINE-GUAIFENESIN ER 60-600 MG PO TB12: 1.0000 | ORAL_TABLET | Freq: Two times a day (BID) | ORAL | Status: DC

## 2013-12-03 NOTE — Progress Notes (Signed)
[facility] [address], [place] [address] [phone]  Date:  12/03/2013   Name:  Patty Martinez   DOB:  1961-01-13   MRN:  [id number]  PCP:  No PCP Per Patient    Chief Complaint: Sore Throat and Flu Vaccine   History of Present Illness:  Patty Martinez is a 53 y.o. very pleasant female patient who presents with the following:  Has nasal congestion and pressure behind her eyes and forehead.  Minimal drainage from the nose.  Mild post nasal drip.   Non productive cough No wheezing or shortness of breath No fever or chills.  No sore throat Feels fatigued and tired.  Some headache. No improvement with over the counter medications or other home remedies.  Denies other complaint or health concern today.   There are no active problems to display for this patient.   Past Medical History  Diagnosis Date  . Arthritis     Past Surgical History  Procedure Laterality Date  . Shoulder surgery  2010  . Knee surgery      History  Substance Use Topics  . Smoking status: Never Smoker   . Smokeless tobacco: Not on file  . Alcohol Use: No    History reviewed. No pertinent family history.  No Known Allergies  Medication list has been reviewed and updated.  Current Outpatient Prescriptions on File Prior to Visit  Medication Sig Dispense Refill  . Calcium Carbonate-Vitamin D (CALCIUM + D PO) Take 1 tablet by mouth daily.      . methocarbamol (ROBAXIN) 500 MG tablet Take 500 mg by mouth as needed.       . pravastatin (PRAVACHOL) 40 MG tablet Take 1 tablet (40 mg total) by mouth daily.  90 tablet  3   No current facility-administered medications on file prior to visit.    Review of Systems:  As per HPI, otherwise negative.    Physical Examination: Filed Vitals:   12/03/13 1009  BP: 145/80  Pulse: 64  Temp: 97.8 F (36.6 C)  Resp: 16   Filed Vitals:   12/03/13 1009  Height:  (1.676 m)  Weight: 145 lb (65.772 kg)   Body mass index  is 23.41 kg/(m^2). Ideal Body Weight: Weight in (lb) to have BMI = 25: 154.6  GEN: WDWN, NAD, Non-toxic, A & O x 3  Persistent cough HEENT: Atraumatic, Normocephalic. Neck supple. No masses, No LAD. Ears and Nose: No external deformity. CV: RRR, No M/G/R. No JVD. No thrill. No extra heart sounds. PULM: CTA B, no wheezes, crackles, rhonchi. No retractions. No resp. distress. No accessory muscle use. ABD: S, NT, ND, +BS. No rebound. No HSM. EXTR: No c/c/e NEURO Normal gait.  PSYCH: Normally interactive. Conversant. Not depressed or anxious appearing.  Calm demeanor.    Assessment and Plan: Sinusitis Bronchitis augmentin  mucinex d Phen c cod  Signed,  Phillips Odor, MD

## 2013-12-03 NOTE — Patient Instructions (Signed)

## 2014-01-12 DIAGNOSIS — M542 Cervicalgia: Secondary | ICD-10-CM | POA: Diagnosis not present

## 2014-01-12 DIAGNOSIS — M9981 Other biomechanical lesions of cervical region: Secondary | ICD-10-CM | POA: Diagnosis not present

## 2014-01-20 DIAGNOSIS — M542 Cervicalgia: Secondary | ICD-10-CM | POA: Diagnosis not present

## 2014-01-20 DIAGNOSIS — M9981 Other biomechanical lesions of cervical region: Secondary | ICD-10-CM | POA: Diagnosis not present

## 2014-02-19 DIAGNOSIS — M542 Cervicalgia: Secondary | ICD-10-CM | POA: Diagnosis not present

## 2014-03-16 ENCOUNTER — Other Ambulatory Visit: Payer: Self-pay

## 2014-03-16 DIAGNOSIS — Z1231 Encounter for screening mammogram for malignant neoplasm of breast: Secondary | ICD-10-CM

## 2014-03-16 DIAGNOSIS — M5012 Cervical disc disorder with radiculopathy, mid-cervical region: Secondary | ICD-10-CM | POA: Diagnosis not present

## 2014-03-19 ENCOUNTER — Ambulatory Visit
Admission: RE | Admit: 2014-03-19 | Discharge: 2014-03-19 | Disposition: A | Discharge status: Home / Self Care | Payer: PRIVATE HEALTH INSURANCE | Source: Ambulatory Visit

## 2014-03-19 DIAGNOSIS — Z1231 Encounter for screening mammogram for malignant neoplasm of breast: Secondary | ICD-10-CM

## 2014-03-30 DIAGNOSIS — M5012 Cervical disc disorder with radiculopathy, mid-cervical region: Secondary | ICD-10-CM | POA: Diagnosis not present

## 2014-05-13 ENCOUNTER — Ambulatory Visit (INDEPENDENT_AMBULATORY_CARE_PROVIDER_SITE_OTHER): Payer: PRIVATE HEALTH INSURANCE | Admitting: Family Medicine

## 2014-05-13 ENCOUNTER — Encounter: Payer: Self-pay | Admitting: Family Medicine

## 2014-05-13 VITALS — BP 112/70 | HR 55 | Temp 97.6°F | Resp 12 | Ht 66.0 in | Wt 146.0 lb

## 2014-05-13 DIAGNOSIS — E785 Hyperlipidemia, unspecified: Secondary | ICD-10-CM | POA: Insufficient documentation

## 2014-05-13 LAB — COMPREHENSIVE METABOLIC PANEL
ALT: 17 U/L (ref 0–35)
AST: 22 U/L (ref 0–37)
Albumin: 4.7 g/dL (ref 3.5–5.2)
Alkaline Phosphatase: 55 U/L (ref 39–117)
BUN: 13 mg/dL (ref 6–23)
CALCIUM: 9.8 mg/dL (ref 8.4–10.5)
CO2: 24 mEq/L (ref 19–32)
Chloride: 105 mEq/L (ref 96–112)
Creat: 0.73 mg/dL (ref 0.50–1.10)
GLUCOSE: 98 mg/dL (ref 70–99)
Potassium: 5.1 mEq/L (ref 3.5–5.3)
SODIUM: 140 meq/L (ref 135–145)
TOTAL PROTEIN: 7.3 g/dL (ref 6.0–8.3)
Total Bilirubin: 0.7 mg/dL (ref 0.2–1.2)

## 2014-05-13 LAB — LIPID PANEL
CHOL/HDL RATIO: 2.9 ratio
Cholesterol: 206 mg/dL — ABNORMAL HIGH (ref 0–200)
HDL: 71 mg/dL (ref >=46)
LDL Cholesterol: 116 mg/dL — ABNORMAL HIGH (ref 0–99)
Triglycerides: 96 mg/dL (ref <150)
VLDL: 19 mg/dL (ref 0–40)

## 2014-05-13 MED ORDER — PRAVASTATIN SODIUM 40 MG PO TABS: 40.0000 mg | ORAL_TABLET | Freq: Every day | ORAL | Status: DC

## 2014-05-13 NOTE — Patient Instructions (Signed)
Continue current dose of pravastatin  We will let you know the results of your labs  Plan to return in about 6 months.

## 2014-05-13 NOTE — Progress Notes (Signed)
Subjective: 54 year old lady who is here for a follow-up with regard to her cholesterol. She takes her medicine faithfully. She has continued to have a lot of trouble with pain in her right arm. She has cervical disc disease. She has not been able to work because of the pain. She is been many many times to physical therapy. She is just living with pain for now though she sees a specialist for her neck and arm. She takes her cholesterol medicine regularly. No other regular meds except for the muscle relaxants and anti-inflammatory meds.  Objective: Pleasant lady previously known to me. Her TMs are normal. Throat clear. Neck supple without nodes or thyromegaly. No carotid bruits. Chest is clear to auscultation. Heart regular without murmurs. Abdomen soft without mass or tenderness.  Assessment: Hyperlipidemia Chronic right arm pain  Plan: Check her lipids. Continue same meds. Return in 6 months, sooner if problems

## 2014-11-02 ENCOUNTER — Ambulatory Visit (INDEPENDENT_AMBULATORY_CARE_PROVIDER_SITE_OTHER): Payer: PRIVATE HEALTH INSURANCE | Admitting: Family Medicine

## 2014-11-02 VITALS — BP 124/76 | HR 56 | Temp 97.6°F | Resp 16 | Ht 67.0 in | Wt 144.8 lb

## 2014-11-02 DIAGNOSIS — Z23 Encounter for immunization: Secondary | ICD-10-CM

## 2014-11-02 DIAGNOSIS — M5010 Cervical disc disorder with radiculopathy, unspecified cervical region: Secondary | ICD-10-CM

## 2014-11-02 DIAGNOSIS — E785 Hyperlipidemia, unspecified: Secondary | ICD-10-CM | POA: Diagnosis not present

## 2014-11-02 DIAGNOSIS — J329 Chronic sinusitis, unspecified: Secondary | ICD-10-CM

## 2014-11-02 LAB — LIPID PANEL
Cholesterol: 196 mg/dL (ref 125–200)
HDL: 61 mg/dL (ref >=46)
LDL CALC: 117 mg/dL (ref <130)
TRIGLYCERIDES: 88 mg/dL (ref <150)
Total CHOL/HDL Ratio: 3.2 Ratio (ref <=5.0)
VLDL: 18 mg/dL (ref <30)

## 2014-11-02 LAB — COMPREHENSIVE METABOLIC PANEL
ALK PHOS: 58 U/L (ref 33–130)
ALT: 14 U/L (ref 6–29)
AST: 20 U/L (ref 10–35)
Albumin: 4.6 g/dL (ref 3.6–5.1)
BILIRUBIN TOTAL: 0.7 mg/dL (ref 0.2–1.2)
BUN: 15 mg/dL (ref 7–25)
CO2: 26 mmol/L (ref 20–31)
CREATININE: 0.67 mg/dL (ref 0.50–1.05)
Calcium: 9.4 mg/dL (ref 8.6–10.4)
Chloride: 104 mmol/L (ref 98–110)
GLUCOSE: 89 mg/dL (ref 65–99)
POTASSIUM: 5 mmol/L (ref 3.5–5.3)
SODIUM: 143 mmol/L (ref 135–146)
TOTAL PROTEIN: 7.4 g/dL (ref 6.1–8.1)

## 2014-11-02 MED ORDER — AMOXICILLIN-POT CLAVULANATE 875-125 MG PO TABS: 1.0000 | ORAL_TABLET | Freq: Two times a day (BID) | ORAL | Status: DC

## 2014-11-02 MED ORDER — FLUTICASONE PROPIONATE 50 MCG/ACT NA SUSP: 2.0000 | Freq: Every day | NASAL | Status: DC

## 2014-11-02 MED ORDER — PREDNISONE 20 MG PO TABS: ORAL_TABLET | ORAL | Status: DC

## 2014-11-02 NOTE — Progress Notes (Signed)
  Subjective:  Patient ID: Patty Martinez, female    DOB: April 22, 1960  Age: 54 y.o. MRN: [id number]  54 year old lady who is well-known to me. Comes in for a recheck with regard to her lipids. Also she's been having problems with her sinuses for some time. She she would like to get something for them before she flies back to [place] to visit her mother next week. She'll be gone overseas for a month. She is on disability from her old right shoulder and neck injury. She continues to see the doctors at [facility] orthopedist for this, and it appears she may end up having to sit at some point have surgery on her neck but she is a little reluctant about that. She has a lot of pain in the neck and shoulder which disables her. She's not been febrile. Not coughing a lot. She is sniffing a lot. She has facial and frontal forehead pain. Her maxillary sinus area is also painful. No problems with ears. Remainder of review of systems was unremarkable.   Objective:   Pleasant alert lady with frequent sniffling. Her TMs are normal. All the minimal sinus tenderness frontal and maxillary. Eyes have a lazy eye on the right which drifts. Her throat is clear. Neck supple without significant nodes. Chest clear. Heart regular without murmurs. Still move her neck.  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  There are no Patient Instructions on file for this visit.   Zyairah Wacha, MD 11/02/2014

## 2014-11-02 NOTE — Patient Instructions (Signed)
Drink plenty of fluids and try and get enough rest. Staying well hydrated it helps keep the secretions think her in your nose and sinuses.  Take over-the-counter Claritin D (loratadine D) or Allegra-D (fexofenadine D) for sinus congestion  Use fluticasone nose spray 2 sprays each nostril twice daily for 4 days, then once daily for the sinuses  Take Augmentin 1 twice daily (amoxicillin clavulanate) for infection  Take the prednisone 20 mg 3 pills daily for 2 days, then 2 daily for 2 days, then 1 daily for 2 days. This will help open up the sinuses but also may give you some temporary relief of the neck and shoulder pain and inflammation before your trip.  Continue taking the cholesterol medication. He should have refills enough to last until March  Plan to return in about 6 months, sooner if problems arise

## 2015-02-11 ENCOUNTER — Ambulatory Visit (INDEPENDENT_AMBULATORY_CARE_PROVIDER_SITE_OTHER): Payer: PRIVATE HEALTH INSURANCE | Admitting: Family Medicine

## 2015-02-11 VITALS — BP 110/70 | HR 102 | Temp 98.5°F | Resp 16 | Ht 66.0 in | Wt 146.6 lb

## 2015-02-11 DIAGNOSIS — R05 Cough: Secondary | ICD-10-CM

## 2015-02-11 DIAGNOSIS — J329 Chronic sinusitis, unspecified: Secondary | ICD-10-CM

## 2015-02-11 DIAGNOSIS — R059 Cough, unspecified: Secondary | ICD-10-CM

## 2015-02-11 DIAGNOSIS — J310 Chronic rhinitis: Secondary | ICD-10-CM

## 2015-02-11 MED ORDER — AMOXICILLIN 875 MG PO TABS: 875.0000 mg | ORAL_TABLET | Freq: Two times a day (BID) | ORAL | Status: DC

## 2015-02-11 MED ORDER — PREDNISONE 20 MG PO TABS: 20.0000 mg | ORAL_TABLET | Freq: Every day | ORAL | Status: DC

## 2015-02-11 MED ORDER — MOMETASONE FUROATE 50 MCG/ACT NA SUSP: NASAL | Status: DC

## 2015-02-11 MED ORDER — BENZONATATE 100 MG PO CAPS: 100.0000 mg | ORAL_CAPSULE | Freq: Three times a day (TID) | ORAL | Status: DC | PRN

## 2015-02-11 NOTE — Patient Instructions (Signed)
Drink plenty of fluids and get enough rest  Take the amoxicillin 875 mg one twice daily at breakfast and supper  Use the mometasone nose spray 2 sprays in each nostril twice daily for about 4 days, then reduce to once daily  Take the benzonatate cough pills one or 2 pills 3 times daily as needed for cough  Take the prednisone 2 pills daily for 3 days to try and open up the inflamed sinuses  Return if not improving or if worse at anytime.

## 2015-02-11 NOTE — Progress Notes (Signed)
Patient ID: Patty Martinez, female    DOB: 08-19-1960  Age: 54 y.o. MRN: [id number]  Chief Complaint  Patient presents with  . Cough  . Headache  . feels cold at night    "like I can't get warm"  . nose" feels closed"    Subjective:   54 year old lady who comes in here with a one-week history of a respiratory tract infection. She has had pressure and pain. Her nose feels very stuffy and she cannot blow anything out of it. Not much in the way of sore throat. She has been coughing. She has chills and then breaks a sweat. She has not taken her temperature. She does not smoke. She is on disability so has not been missing any work anywhere.  Current allergies, medications, problem list, past/family and social histories reviewed.  Objective:  BP 110/70 mmHg  Pulse 102  Temp(Src) 98.5 F (36.9 C) (Oral)  Resp 16  Ht 5\' 6"  (1.676 m)  Wt 146 lb 9.6 oz (66.497 kg)  BMI 23.67 kg/m2  SpO2 98%  LMP 11/14/2012  Pleasant lady well-known to me. Her TMs are normal. Nose congested. Tender frontal and maxillary sinus regions. Throat mildly erythematous. Neck supple without significant nodes. Chest is clear to auscultation. Heart regular without murmurs. She has a persistent hacking cough.  Assessment & Plan:   Assessment: No diagnosis found.    Plan: This is been going on for a week, we'll go ahead and treat.  No orders of the defined types were placed in this encounter.    No orders of the defined types were placed in this encounter.         There are no Patient Instructions on file for this visit.   No Follow-up on file.   HOPPER,DAVID, MD 02/11/2015

## 2015-04-29 ENCOUNTER — Ambulatory Visit (INDEPENDENT_AMBULATORY_CARE_PROVIDER_SITE_OTHER): Payer: Medicare Other | Admitting: Family Medicine

## 2015-04-29 DIAGNOSIS — H833X1 Noise effects on right inner ear: Secondary | ICD-10-CM

## 2015-04-29 DIAGNOSIS — H6981 Other specified disorders of Eustachian tube, right ear: Secondary | ICD-10-CM | POA: Diagnosis not present

## 2015-04-29 MED ORDER — FLUTICASONE PROPIONATE 50 MCG/ACT NA SUSP: 2.0000 | Freq: Every day | NASAL | Status: DC

## 2015-04-29 MED ORDER — PREDNISONE 20 MG PO TABS: ORAL_TABLET | ORAL | Status: DC

## 2015-04-29 NOTE — Progress Notes (Signed)
Patient ID: Patty Martinez, female    DOB: 01-06-1961  Age: 55 y.o. MRN: [id number]  Chief Complaint  Patient presents with  . Ear Pain    right ear    Subjective:   Patient who has come here regularly. She has for several days been having an abnormal sound in her right ear and pain in the right ear. She has prior to that been having right sided temporal and ear area headaches. No other associated symptoms. Does not have a cold or cough.  Current allergies, medications, problem list, past/family and social histories reviewed.  Objective:  BP 110/80 mmHg  Pulse 75  Temp(Src) 98.3 F (36.8 C) (Oral)  Resp 20  Ht  (1.727 m)  Wt 150 lb 12.8 oz (68.402 kg)  BMI 22.93 kg/m2  SpO2 99%  LMP 11/14/2012  TMs normal in the left eardrum the right there is mild dullness centrally that looks like it could be a little fluid behind the drum. She has a 1 mm border of erythema on the top of the drum. The TM is not quite shiny on the right as it is on the left. Throat was clear. Neck supple with one small right cervical node just below the angle of the jaw. No TMJ type popping or pain. She is alert and oriented. Has a disconjugate gaze from a lazy eye.  Assessment & Plan:   Assessment: 1. Eustachian tube dysfunction, right   2. Noise effect on right inner ear       Plan: No studies today. We'll treat to try to open the eustachian tube.  No orders of the defined types were placed in this encounter.    No orders of the defined types were placed in this encounter.         Patient Instructions  Take prednisone again 3 pills daily for 3 days  Take fluticasone nose spray 2 sprays each nostril twice daily for 4 days, then decrease to once daily  If the pain and sounding the ear persist please return  Take over-the-counter ibuprofen 200 mg 3 pills every 6-8 hours as needed for pain  Return as necessary     Return if symptoms worsen or fail to improve.   HOPPER,DAVID, MD  04/29/2015

## 2015-04-29 NOTE — Patient Instructions (Signed)
Take prednisone again 3 pills daily for 3 days  Take fluticasone nose spray 2 sprays each nostril twice daily for 4 days, then decrease to once daily  If the pain and sounding the ear persist please return  Take over-the-counter ibuprofen 200 mg 3 pills every 6-8 hours as needed for pain  Return as necessary

## 2015-05-12 ENCOUNTER — Ambulatory Visit (INDEPENDENT_AMBULATORY_CARE_PROVIDER_SITE_OTHER): Payer: Medicare Other | Admitting: Family Medicine

## 2015-05-12 VITALS — BP 110/64 | HR 66 | Temp 98.0°F | Resp 20 | Ht 68.0 in | Wt 150.2 lb

## 2015-05-12 DIAGNOSIS — Z1211 Encounter for screening for malignant neoplasm of colon: Secondary | ICD-10-CM | POA: Diagnosis not present

## 2015-05-12 DIAGNOSIS — E785 Hyperlipidemia, unspecified: Secondary | ICD-10-CM

## 2015-05-12 DIAGNOSIS — B351 Tinea unguium: Secondary | ICD-10-CM | POA: Diagnosis not present

## 2015-05-12 LAB — COMPREHENSIVE METABOLIC PANEL
ALBUMIN: 4.4 g/dL (ref 3.6–5.1)
ALK PHOS: 55 U/L (ref 33–130)
ALT: 17 U/L (ref 6–29)
AST: 22 U/L (ref 10–35)
BILIRUBIN TOTAL: 0.5 mg/dL (ref 0.2–1.2)
BUN: 10 mg/dL (ref 7–25)
CO2: 28 mmol/L (ref 20–31)
CREATININE: 0.78 mg/dL (ref 0.50–1.05)
Calcium: 9.5 mg/dL (ref 8.6–10.4)
Chloride: 105 mmol/L (ref 98–110)
GLUCOSE: 100 mg/dL — AB (ref 65–99)
Potassium: 4.3 mmol/L (ref 3.5–5.3)
SODIUM: 139 mmol/L (ref 135–146)
Total Protein: 7.2 g/dL (ref 6.1–8.1)

## 2015-05-12 LAB — LIPID PANEL
CHOL/HDL RATIO: 2.9 ratio (ref <=5.0)
CHOLESTEROL: 192 mg/dL (ref 125–200)
HDL: 67 mg/dL (ref >=46)
LDL Cholesterol: 109 mg/dL (ref <130)
Triglycerides: 81 mg/dL (ref <150)
VLDL: 16 mg/dL (ref <30)

## 2015-05-12 MED ORDER — PRAVASTATIN SODIUM 40 MG PO TABS: 40.0000 mg | ORAL_TABLET | Freq: Every day | ORAL | Status: DC

## 2015-05-12 NOTE — Progress Notes (Signed)
Patient ID: Patty Martinez, female    DOB: 11/13/1960  Age: 55 y.o. MRN: [id number]  Chief Complaint  Patient presents with  . Other    Blood work, cholesterol     Subjective:   Pleasant lady well-known to me. She is here for recheck of her blood work and cholesterol. We discussed general health today. She is due her yearly mammogram, and will plan to get that. She has never had a colonoscopy. Discussed that she is willing for me to schedule one for her. She gets some exercise. No other major physical complaints on review of systems. She does have some chronic pains on the right side of her body. She also has occasional mild headaches.  Current allergies, medications, problem list, past/family and social histories reviewed.  Objective:  BP 110/64 mmHg  Pulse 66  Temp(Src) 98 F (36.7 C) (Oral)  Resp 20  Ht  (1.727 m)  Wt 150 lb 3.2 oz (68.13 kg)  BMI 22.84 kg/m2  SpO2 99%  LMP 11/14/2012  Pleasant lady, alert and oriented. Chest clear. Heart regular without murmurs. No thyromegaly.  Assessment & Plan:   Assessment: 1. Hyperlipidemia   2. Special screening for malignant neoplasms, colon   3. Onychomycosis       Plan: Check labs. Discussed my retirement with her.  Orders Placed This Encounter  Procedures  . Comprehensive metabolic panel  . Lipid panel  . Ambulatory referral to Gastroenterology    Referral Priority:  Routine    Referral Type:  Consultation    Referral Reason:  Specialty Services Required    Number of Visits Requested:  1    Meds ordered this encounter  Medications  . pravastatin (PRAVACHOL) 40 MG tablet    Sig: Take 1 tablet (40 mg total) by mouth daily.    Dispense:  90 tablet    Refill:  3         Patient Instructions  Referral will be made for a colonoscopy. If you do not hear from anyone regarding that within the next week or 10 days please call and speak to the referral's desk  Continue your pravastatin for cholesterol. I will  let you know the results of your labs  Continue trying to get regular exercise  If the toenails are getting worse let us know because we could then arrange for medication, but at this point I believe it is best just to leave it alone  Consider finding a primary care with [facility] or [facility] or [facility] or elsewhere.  Otherwise can continue to follow up here on an as needed basis.       Return in about 6 months (around 11/12/2015), or if symptoms worsen or fail to improve.   HOPPER,DAVID, MD 05/12/2015

## 2015-05-12 NOTE — Patient Instructions (Signed)
Referral will be made for a colonoscopy. If you do not hear from anyone regarding that within the next week or 10 days please call and speak to the referral's desk  Continue your pravastatin for cholesterol. I will let you know the results of your labs  Continue trying to get regular exercise  If the toenails are getting worse let us know because we could then arrange for medication, but at this point I believe it is best just to leave it alone  Consider finding a primary care with [facility] or [facility] or [facility] or elsewhere.  Otherwise can continue to follow up here on an as needed basis.

## 2015-05-14 ENCOUNTER — Encounter: Payer: Self-pay | Admitting: *Deleted

## 2015-05-18 ENCOUNTER — Encounter: Payer: Self-pay | Admitting: Gastroenterology

## 2015-07-04 ENCOUNTER — Ambulatory Visit (AMBULATORY_SURGERY_CENTER): Payer: Self-pay | Admitting: *Deleted

## 2015-07-04 VITALS — Ht 66.0 in | Wt 151.0 lb

## 2015-07-04 DIAGNOSIS — Z1211 Encounter for screening for malignant neoplasm of colon: Secondary | ICD-10-CM

## 2015-07-04 NOTE — Progress Notes (Signed)
Translator at patient's side during entire pre-visit today. Copy of consent given to patient per her request. Patient denies any allergies to eggs or soy. Patient denies any problems with anesthesia/sedation. Patient denies any oxygen use at home and does not take any diet/weight loss medications.

## 2015-07-12 ENCOUNTER — Ambulatory Visit (AMBULATORY_SURGERY_CENTER): Payer: Medicare Other | Admitting: Gastroenterology

## 2015-07-12 ENCOUNTER — Encounter: Payer: Self-pay | Admitting: Gastroenterology

## 2015-07-12 VITALS — BP 123/81 | HR 56 | Temp 98.2°F | Resp 11 | Ht 66.0 in | Wt 151.0 lb

## 2015-07-12 DIAGNOSIS — Z1211 Encounter for screening for malignant neoplasm of colon: Secondary | ICD-10-CM | POA: Diagnosis not present

## 2015-07-12 MED ORDER — SODIUM CHLORIDE 0.9 % IV SOLN: 500.0000 mL | INTRAVENOUS | Status: DC

## 2015-07-12 NOTE — Op Note (Signed)
[facility] Patient Name: Patty Martinez Procedure Date: 07/12/2015 9:34 AM MRN: [id number] Endoscopist: Sherilyn CooterHenry L. Myrtie Neitheranis , MD Age: [age] Date of Birth: 1961/02/14 Gender: Female Procedure:                Colonoscopy Indications:              Screening for colorectal malignant neoplasm Medicines:                Monitored Anesthesia Care Procedure:                Pre-Anesthesia Assessment:                           - Prior to the procedure, a History and Physical                            was performed, and patient medications and                            allergies were reviewed. The patient's tolerance of                            previous anesthesia was also reviewed. The risks                            and benefits of the procedure and the sedation                            options and risks were discussed with the patient.                            All questions were answered, and informed consent                            was obtained. Prior Anticoagulants: The patient has                            taken no previous anticoagulant or antiplatelet                            agents. ASA Grade Assessment: I - A normal, healthy                            patient. After reviewing the risks and benefits,                            the patient was deemed in satisfactory condition to                            undergo the procedure.                           After obtaining informed consent, the colonoscope  was passed under direct vision. Throughout the                            procedure, the patient's blood pressure, pulse, and                            oxygen saturations were monitored continuously. The                            Model CF-HQ190L [id number]) scope was introduced                            through the anus and advanced to the the cecum,                            identified by appendiceal orifice and ileocecal         valve. The colonoscopy was performed without                            difficulty. The patient tolerated the procedure                            well. The quality of the bowel preparation was                            excellent. The ileocecal valve, appendiceal                            orifice, and rectum were photographed. Scope In: 9:44:45 AM Scope Out: 9:58:56 AM Scope Withdrawal Time: 0 hours 7 minutes 39 seconds  Total Procedure Duration: 0 hours 14 minutes 11 seconds  Findings:                 The perianal and digital rectal examinations were                            normal.                           The entire examined colon appeared normal on direct                            and retroflexion views. Complications:            No immediate complications. Estimated Blood Loss:     Estimated blood loss: none. Impression:               - The entire examined colon is normal on direct and                            retroflexion views.                           - No specimens collected. Recommendation:           - Patient has a contact number available for  emergencies. The signs and symptoms of potential                            delayed complications were discussed with the                            patient. Return to normal activities tomorrow.                            Written discharge instructions were provided to the                            patient.                           - Resume previous diet.                           - Continue present medications.                           - Repeat colonoscopy in 10 years for screening                            purposes. Tyshell Ramberg L. Myrtie Neither, MD 07/12/2015 10:02:18 AM This report has been signed electronically.

## 2015-07-12 NOTE — Patient Instructions (Signed)
YOU HAD AN ENDOSCOPIC PROCEDURE TODAY AT THE [facility]:   Refer to the procedure report that was given to you for any specific questions about what was found during the examination.  If the procedure report does not answer your questions, please call your gastroenterologist to clarify.  If you requested that your care partner not be given the details of your procedure findings, then the procedure report has been included in a sealed envelope for you to review at your convenience later.  YOU SHOULD EXPECT: Some feelings of bloating in the abdomen. Passage of more gas than usual.  Walking can help get rid of the air that was put into your GI tract during the procedure and reduce the bloating. If you had a lower endoscopy (such as a colonoscopy or flexible sigmoidoscopy) you may notice spotting of blood in your stool or on the toilet paper. If you underwent a bowel prep for your procedure, you may not have a normal bowel movement for a few days.  Please Note:  You might notice some irritation and congestion in your nose or some drainage.  This is from the oxygen used during your procedure.  There is no need for concern and it should clear up in a day or so.  SYMPTOMS TO REPORT IMMEDIATELY:   Following lower endoscopy (colonoscopy or flexible sigmoidoscopy):  Excessive amounts of blood in the stool  Significant tenderness or worsening of abdominal pains  Swelling of the abdomen that is new, acute  Fever of 100F or higher   For urgent or emergent issues, a gastroenterologist can be reached at any hour by calling [phone].   DIET: Your first meal following the procedure should be a small meal and then it is ok to progress to your normal diet. Heavy or fried foods are harder to digest and may make you feel nauseous or bloated.  Likewise, meals heavy in dairy and vegetables can increase bloating.  Drink plenty of fluids but you should avoid alcoholic beverages for 24  hours.  ACTIVITY:  You should plan to take it easy for the rest of today and you should NOT DRIVE or use heavy machinery until tomorrow (because of the sedation medicines used during the test).    FOLLOW UP: Our staff will call the number listed on your records the next business day following your procedure to check on you and address any questions or concerns that you may have regarding the information given to you following your procedure. If we do not reach you, we will leave a message.  However, if you are feeling well and you are not experiencing any problems, there is no need to return our call.  We will assume that you have returned to your regular daily activities without incident.  If any biopsies were taken you will be contacted by phone or by letter within the next 1-3 weeks.  Please call us at [phone] if you have not heard about the biopsies in 3 weeks.    SIGNATURES/CONFIDENTIALITY: You and/or your care partner have signed paperwork which will be entered into your electronic medical record.  These signatures attest to the fact that that the information above on your After Visit Summary has been reviewed and is understood.  Full responsibility of the confidentiality of this discharge information lies with you and/or your care-partner.   Resume medications. 

## 2015-07-12 NOTE — Progress Notes (Signed)
A and Ox 3 Report to RN 

## 2015-07-13 ENCOUNTER — Telehealth: Payer: Self-pay | Admitting: *Deleted

## 2015-07-13 NOTE — Telephone Encounter (Signed)
  Follow up Call-  Call back number 07/12/2015  Post procedure Call Back phone  # [phone]  Permission to leave phone message Yes     Patient questions:  Do you have a fever, pain , or abdominal swelling? No. Pain Score  0 *  Have you tolerated food without any problems? Yes.    Have you been able to return to your normal activities? Yes.    Do you have any questions about your discharge instructions: Diet   No. Medications  No. Follow up visit  No.  Do you have questions or concerns about your Care? Yes.    Actions: * If pain score is 4 or above: No action needed, pain <4. Patient stating she has a runny nose and eyes. No discoloration of drainage and no fever or other symptoms. Discussed with patient use of 02 can cause this discomfort. Patient will contact her PCP if fever develops or discoloration of drainage.

## 2015-10-07 DIAGNOSIS — Z1231 Encounter for screening mammogram for malignant neoplasm of breast: Secondary | ICD-10-CM | POA: Diagnosis not present

## 2015-10-07 DIAGNOSIS — Z124 Encounter for screening for malignant neoplasm of cervix: Secondary | ICD-10-CM | POA: Diagnosis not present

## 2015-11-08 ENCOUNTER — Ambulatory Visit (INDEPENDENT_AMBULATORY_CARE_PROVIDER_SITE_OTHER): Payer: Medicare Other | Admitting: Physician Assistant

## 2015-11-08 VITALS — BP 122/72 | HR 59 | Temp 98.9°F | Resp 17 | Ht 67.5 in | Wt 144.0 lb

## 2015-11-08 DIAGNOSIS — Z0000 Encounter for general adult medical examination without abnormal findings: Secondary | ICD-10-CM

## 2015-11-08 DIAGNOSIS — R5383 Other fatigue: Secondary | ICD-10-CM

## 2015-11-08 DIAGNOSIS — Z23 Encounter for immunization: Secondary | ICD-10-CM

## 2015-11-08 DIAGNOSIS — E785 Hyperlipidemia, unspecified: Secondary | ICD-10-CM

## 2015-11-08 LAB — CBC WITH DIFFERENTIAL/PLATELET
BASOS PCT: 0 %
Basophils Absolute: 0 cells/uL (ref 0–200)
EOS ABS: 49 {cells}/uL (ref 15–500)
Eosinophils Relative: 1 %
HEMATOCRIT: 38.9 % (ref 35.0–45.0)
Hemoglobin: 13.1 g/dL (ref 11.7–15.5)
LYMPHS ABS: 2009 {cells}/uL (ref 850–3900)
Lymphocytes Relative: 41 %
MCH: 30.5 pg (ref 27.0–33.0)
MCHC: 33.7 g/dL (ref 32.0–36.0)
MCV: 90.7 fL (ref 80.0–100.0)
MONO ABS: 490 {cells}/uL (ref 200–950)
MPV: 9.6 fL (ref 7.5–12.5)
Monocytes Relative: 10 %
NEUTROS ABS: 2352 {cells}/uL (ref 1500–7800)
Neutrophils Relative %: 48 %
PLATELETS: 254 10*3/uL (ref 140–400)
RBC: 4.29 MIL/uL (ref 3.80–5.10)
RDW: 12.9 % (ref 11.0–15.0)
WBC: 4.9 10*3/uL (ref 3.8–10.8)

## 2015-11-08 LAB — COMPLETE METABOLIC PANEL WITH GFR
ALT: 15 U/L (ref 6–29)
AST: 20 U/L (ref 10–35)
Albumin: 4.5 g/dL (ref 3.6–5.1)
Alkaline Phosphatase: 52 U/L (ref 33–130)
BILIRUBIN TOTAL: 0.5 mg/dL (ref 0.2–1.2)
BUN: 11 mg/dL (ref 7–25)
CALCIUM: 9.2 mg/dL (ref 8.6–10.4)
CO2: 27 mmol/L (ref 20–31)
CREATININE: 0.71 mg/dL (ref 0.50–1.05)
Chloride: 106 mmol/L (ref 98–110)
GFR, Est Non African American: >89 mL/min (ref >=60)
Glucose, Bld: 94 mg/dL (ref 65–99)
Potassium: 4.1 mmol/L (ref 3.5–5.3)
Sodium: 141 mmol/L (ref 135–146)
TOTAL PROTEIN: 7.1 g/dL (ref 6.1–8.1)

## 2015-11-08 LAB — LIPID PANEL
Cholesterol: 182 mg/dL (ref 125–200)
HDL: 53 mg/dL (ref >=46)
LDL CALC: 105 mg/dL (ref <130)
TRIGLYCERIDES: 122 mg/dL (ref <150)
Total CHOL/HDL Ratio: 3.4 Ratio (ref <=5.0)
VLDL: 24 mg/dL (ref <30)

## 2015-11-08 NOTE — Patient Instructions (Addendum)
Continue your healthy lifestyle! It was great meeting you today, I look forward to following your care!   Heart-Healthy Eating Plan Heart-healthy meal planning includes:  Limiting unhealthy fats.  Increasing healthy fats.  Making other small dietary changes. You may need to talk with your doctor or a diet specialist (dietitian) to create an eating plan that is right for you. WHAT TYPES OF FAT SHOULD I CHOOSE?  Choose healthy fats. These include olive oil and canola oil, flaxseeds, walnuts, almonds, and seeds.  Eat more omega-3 fats. These include salmon, mackerel, sardines, tuna, flaxseed oil, and ground flaxseeds. Try to eat fish at least twice each week.  Limit saturated fats.  Saturated fats are often found in animal products, such as meats, butter, and cream.  Plant sources of saturated fats include palm oil, palm kernel oil, and coconut oil.  Avoid foods with partially hydrogenated oils in them. These include stick margarine, some tub margarines, cookies, crackers, and other baked goods. These contain trans fats. WHAT GENERAL GUIDELINES DO I NEED TO FOLLOW?  Check food labels carefully. Identify foods with trans fats or high amounts of saturated fat.  Fill one half of your plate with vegetables and green salads. Eat 4-5 servings of vegetables per day. A serving of vegetables is:  1 cup of raw leafy vegetables.   cup of raw or cooked cut-up vegetables.   cup of vegetable juice.  Fill one fourth of your plate with whole grains. Look for the word "whole" as the first word in the ingredient list.  Fill one fourth of your plate with lean protein foods.  Eat 4-5 servings of fruit per day. A serving of fruit is:  One medium whole fruit.   cup of dried fruit.   cup of fresh, frozen, or canned fruit.   cup of 100% fruit juice.  Eat more foods that contain soluble fiber. These include apples, broccoli, carrots, beans, peas, and barley. Try to get 20-30 g of fiber  per day.  Eat more home-cooked food. Eat less restaurant, buffet, and fast food.  Limit or avoid alcohol.  Limit foods high in starch and sugar.  Avoid fried foods.  Avoid frying your food. Try baking, boiling, grilling, or broiling it instead. You can also reduce fat by:  Removing the skin from poultry.  Removing all visible fats from meats.  Skimming the fat off of stews, soups, and gravies before serving them.  Steaming vegetables in water or broth.  Lose weight if you are overweight.  Eat 4-5 servings of nuts, legumes, and seeds per week:  One serving of dried beans or legumes equals  cup after being cooked.  One serving of nuts equals 1 ounces.  One serving of seeds equals  ounce or one tablespoon.  You may need to keep track of how much salt or sodium you eat. This is especially true if you have high blood pressure. Talk with your doctor or dietitian to get more information. WHAT FOODS CAN I EAT? Grains Breads, including [place], white, pita, wheat, raisin, rye, oatmeal, and [place]. Tortillas that are neither fried nor made with lard or trans fat. Low-fat rolls, including hotdog and hamburger buns and English muffins. Biscuits. Muffins. Waffles. Pancakes. Light popcorn. Whole-grain cereals. Flatbread. Melba toast. Pretzels. Breadsticks. Rusks. Low-fat snacks. Low-fat crackers, including oyster, saltine, matzo, graham, animal, and rye. Rice and pasta, including brown rice and pastas that are made with whole wheat.  Vegetables All vegetables.  Fruits All fruits, but limit coconut. Meats  and Other Protein Sources Lean, well-trimmed beef, veal, pork, and lamb. Chicken and [place] without skin. All fish and shellfish. Wild duck, rabbit, pheasant, and venison. Egg whites or low-cholesterol egg substitutes. Dried beans, peas, lentils, and tofu. Seeds and most nuts. Dairy Low-fat or nonfat cheeses, including ricotta, string, and mozzarella. Skim or 1% milk that is liquid,  powdered, or evaporated. Buttermilk that is made with low-fat milk. Nonfat or low-fat yogurt. Beverages Mineral water. Diet carbonated beverages. Sweets and Desserts Sherbets and fruit ices. Honey, jam, marmalade, jelly, and syrups. Meringues and gelatins. Pure sugar candy, such as hard candy, jelly beans, gumdrops, mints, marshmallows, and small amounts of dark chocolate. [employer] M[employer]el food cake. Eat all sweets and desserts in moderation. Fats and Oils Nonhydrogenated (trans-free) margarines. Vegetable oils, including soybean, sesame, sunflower, olive, peanut, safflower, corn, canola, and cottonseed. Salad dressings or mayonnaise made with a vegetable oil. Limit added fats and oils that you use for cooking, baking, salads, and as spreads. Other Cocoa powder. Coffee and tea. All seasonings and condiments. The items listed above may not be a complete list of recommended foods or beverages. Contact your dietitian for more options. WHAT FOODS ARE NOT RECOMMENDED? Grains Breads that are made with saturated or trans fats, oils, or whole milk. Croissants. Butter rolls. Cheese breads. Sweet rolls. Donuts. Buttered popcorn. Chow mein noodles. High-fat crackers, such as cheese or butter crackers. Meats and Other Protein Sources Fatty meats, such as hotdogs, short ribs, sausage, spareribs, bacon, rib eye roast or steak, and mutton. High-fat deli meats, such as salami and bologna. Caviar. Domestic duck and goose. Organ meats, such as kidney, liver, sweetbreads, and heart. Dairy Cream, sour cream, cream cheese, and creamed cottage cheese. Whole-milk cheeses, including blue (bleu), [address], [place], [place], [address], [place], [address], cheddar, [place], and [place]. Whole or 2% milk that is liquid, evaporated, or condensed. Whole buttermilk. Cream sauce or high-fat cheese sauce. Yogurt that is made from whole milk. Beverages Regular sodas and juice drinks with added sugar. Sweets and Desserts Frosting. Pudding.  Cookies. Cakes other than angel food cake. Candy that has milk chocolate or white chocolate, hydrogenated fat, butter, coconut, or unknown ingredients. Buttered syrups. Full-fat ice cream or ice cream drinks. Fats and Oils Gravy that has suet, meat fat, or shortening. Cocoa butter, hydrogenated oils, palm oil, coconut oil, palm kernel oil. These can often be found in baked products, candy, fried foods, nondairy creamers, and whipped toppings. Solid fats and shortenings, including bacon fat, salt pork, lard, and butter. Nondairy cream substitutes, such as coffee creamers and sour cream substitutes. Salad dressings that are made of unknown oils, cheese, or sour cream. The items listed above may not be a complete list of foods and beverages to avoid. Contact your dietitian for more information.   This information is not intended to replace advice given to you by your health care provider. Make sure you discuss any questions you have with your health care provider.   Document Released: 08/28/2011 Document Revised: 03/19/2014 Document Reviewed: 08/20/2013 Elsevier Interactive Patient Education [employer]2016 Elsevier Inc.    IF you received an x-ray today, you will receive an invoice from [facility]. Please contact [facility] at [phone] with questions or concerns regarding your invoice.   IF you received labwork today, you will receive an invoice from [facility]. Please contact [facility] at [phone] with questions or concerns regarding your invoice.   Our billing staff will not be able to assist you with questions regarding bills from these  companies.  You will be contacted with the lab results as soon as they are available. The fastest way to get your results is to activate your My Chart account. Instructions are located on the last page of this paperwork. If you have not heard from [place] regarding the results in 2 weeks, please contact this office.

## 2015-11-08 NOTE — Progress Notes (Signed)
Patty Martinez  MRN: [id number] DOB: 1960/08/28  Subjective:  Pt is a 55 y.o. female who presents for annual physical exam. Pt has no other complaints today. Pt reports she has been seen for medicare annual visit in the past but there is no documentation of this visit. Her last acute visit was on 05/12/2015. No interpreter was used.   Patient Care Team    Relationship Specialty Notifications Start End  [place], [place] PCP - General Physician Assistant  11/08/15    Social: Pt moved here from [place] 18 years ago and has a great support system. Lives with her husband.  Attends a local church,which has a pretty large [place] community. She is currently out of a job on disability due to shoulder and knee injury. Has a torn rotator cuff in right shoulder and torn meniscus in right knee. She also has narrowing of joint space in C4-C5. She is followed closely by orthopedics.   Diet: She has a well balanced diet. Likes grilled chicken, fish, vegetables, and fruits. Minimal red meat. Drinks only water and coffee.    Exercise: She walks weekly. Tries to go for 30 minutes a day 4-5 times a day. Notes her right knee does bother her when she is walking but she just pushes through.  Sleep: She has trouble sleeping  due to her joint pain from previous surgeries in shoulder and knee. Has chronic neck pain which she may need surgery for in the future. Gets about 8 hours of sleep a night.   Last dental exam: 11/05/2015 Last vision exam: 02/2015 Last pap smear: 2016, followed by OB/GYN Last mammogram: 09/2015 Last colonoscopy: 07/2015 Vaccinations      Tetanus 11/17/2013  Functional Status Survey: Is the patient deaf or have difficulty hearing?: No Does the patient have difficulty seeing, even when wearing glasses/contacts?: No Does the patient have difficulty concentrating, remembering, or making decisions?: No Does the patient have difficulty walking or climbing stairs?: No Does  the patient have difficulty dressing or bathing?: No Does the patient have difficulty doing errands alone such as visiting a doctor's office or shopping?: Yes (only can lift 2 lbs)  Depression screen [facility] 2/9 11/08/2015 05/12/2015 04/29/2015 02/11/2015 11/02/2014  Decreased Interest 0 0 0 0 0  Down, Depressed, Hopeless 0 0 0 0 0  PHQ - 2 Score 0 0 0 0 0   The patient has not had a fall in the last year.  Advanced care directive discussed with patient. Information regarding this has been mailed to patient.   Patient Active Problem List   Diagnosis Date Noted  . Hyperlipidemia 05/13/2014    Current Outpatient Prescriptions on File Prior to Visit  Medication Sig Dispense Refill  . Calcium Carbonate-Vitamin D (CALCIUM + D PO) Take 1 tablet by mouth daily. Reported on 07/04/2015    . ibuprofen (ADVIL) 200 MG tablet Take 200 mg by mouth every 6 (six) hours as needed. Reported on 04/29/2015    . pravastatin (PRAVACHOL) 40 MG tablet Take 1 tablet (40 mg total) by mouth daily. 90 tablet 3   No current facility-administered medications on file prior to visit.     No Known Allergies  Social History   Social History  . Marital status: Married    Spouse name: N/A  . Number of children: N/A  . Years of education: N/A   Social History Main Topics  . Smoking status: Never Smoker  . Smokeless tobacco: Never Used  . Alcohol use No  .  Drug use: No  . Sexual activity: Yes    Partners: Male     Comment: monogamous with husbnad   Other Topics Concern  . None   Social History Narrative   About to be laid off from work due to shoulder injury, previously worked at [employer]    Past Surgical History:  Procedure Laterality Date  . KNEE SURGERY    . shoulder surgery  2010    Family History  Problem Relation Age of Onset  . Colon cancer Neg Hx     Review of Systems  Constitutional: Negative.   HENT: Negative.   Eyes: Negative.   Respiratory: Negative.   Cardiovascular: Negative.    Gastrointestinal: Negative.   Endocrine: Negative.   Genitourinary: Negative.   Musculoskeletal: Positive for arthralgias.  Skin: Negative.   Allergic/Immunologic: Negative.   Neurological: Negative.   Hematological: Negative.   Psychiatric/Behavioral: Negative.    Objective:  BP 122/72 (BP Location: Right Arm, Patient Position: Sitting, Cuff Size: Normal)   Pulse (!) 59   Temp 98.9 F (37.2 C) (Oral)   Resp 17   Ht 5' 7.5" (1.715 m)   Wt 144 lb (65.3 kg)   LMP 11/14/2012   SpO2 99%   BMI 22.22 kg/m   Physical Exam  Constitutional: She is oriented to person, place, and time and well-developed, well-nourished, and in no distress.  HENT:  Head: Normocephalic and atraumatic.  Right Ear: Hearing, tympanic membrane, external ear and ear canal normal.  Left Ear: Hearing, tympanic membrane, external ear and ear canal normal.  Nose: Nose normal.  Mouth/Throat: Uvula is midline, oropharynx is clear and moist and mucous membranes are normal. No oropharyngeal exudate.  Eyes: Conjunctivae, EOM and lids are normal. Pupils are equal, round, and reactive to light. No scleral icterus.  Neck: Trachea normal and normal range of motion. No thyroid mass and no thyromegaly present.  Cardiovascular: Normal rate, regular rhythm, normal heart sounds and intact distal pulses.   Pulmonary/Chest: Effort normal and breath sounds normal.  Abdominal: Soft. Normal appearance and bowel sounds are normal. There is no tenderness.  Musculoskeletal:       Right shoulder: She exhibits decreased range of motion and tenderness.       Right knee: Tenderness found.       Left knee: Normal.       Cervical back: She exhibits tenderness ( with flexion and extension ).  Lymphadenopathy:       Head (right side): No tonsillar, no preauricular, no posterior auricular and no occipital adenopathy present.       Head (left side): No tonsillar, no preauricular, no posterior auricular and no occipital adenopathy present.     She has no cervical adenopathy.       Right: No supraclavicular adenopathy present.       Left: No supraclavicular adenopathy present.  Neurological: She is alert and oriented to person, place, and time. She has normal sensation and normal strength. Gait normal.  Skin: Skin is warm and dry.  Psychiatric: Affect normal.    Visual Acuity Screening   Right eye Left eye Both eyes  Without correction: 0 20/30 20/30  With correction:        Assessment and Plan :  Discussed healthy lifestyle, diet, exercise, preventative care, vaccinations, and addressed patient's concerns. Plan for follow up in 6 months. Otherwise, plan for specific conditions below.  1. Medicare annual wellness visit, subsequent -Await lab results  2. Hyperlipidemia - COMPLETE METABOLIC PANEL WITH  GFR - Lipid panel -Follow up in six months   4. Need for influenza vaccination - Flu Vaccine QUAD 36+ mos IM  5. Other fatigue - CBC with Differential/Platelet  Benjiman CoreBrittany Sedale Jenifer PA-C  [facility] 11/08/2015 8:41 AM

## 2015-12-30 ENCOUNTER — Ambulatory Visit (INDEPENDENT_AMBULATORY_CARE_PROVIDER_SITE_OTHER): Payer: Medicare Other | Admitting: Physician Assistant

## 2015-12-30 VITALS — BP 122/72 | HR 80 | Temp 98.5°F | Resp 17 | Ht 67.5 in | Wt 144.0 lb

## 2015-12-30 DIAGNOSIS — R059 Cough, unspecified: Secondary | ICD-10-CM

## 2015-12-30 DIAGNOSIS — J0120 Acute ethmoidal sinusitis, unspecified: Secondary | ICD-10-CM | POA: Diagnosis not present

## 2015-12-30 DIAGNOSIS — R05 Cough: Secondary | ICD-10-CM | POA: Diagnosis not present

## 2015-12-30 DIAGNOSIS — R0981 Nasal congestion: Secondary | ICD-10-CM

## 2015-12-30 MED ORDER — FLUTICASONE PROPIONATE 50 MCG/ACT NA SUSP: 2.0000 | Freq: Every day | NASAL | 0 refills | Status: DC

## 2015-12-30 MED ORDER — BENZONATATE 100 MG PO CAPS: 100.0000 mg | ORAL_CAPSULE | Freq: Three times a day (TID) | ORAL | 0 refills | Status: DC | PRN

## 2015-12-30 MED ORDER — AMOXICILLIN-POT CLAVULANATE 875-125 MG PO TABS: 1.0000 | ORAL_TABLET | Freq: Two times a day (BID) | ORAL | 0 refills | Status: AC

## 2015-12-30 NOTE — Patient Instructions (Addendum)
  Take antibiotics as prescribed.  Use flonase for congestion.  Use tessalon perles for cough.  -Return to clinic if symptoms worsen, do not improve, or as needed    Sinusitis, Adult Sinusitis is redness, soreness, and puffiness (inflammation) of the air pockets in the bones of your face (sinuses). The redness, soreness, and puffiness can cause air and mucus to get trapped in your sinuses. This can allow germs to grow and cause an infection.  HOME CARE   Drink enough fluids to keep your pee (urine) clear or pale yellow.  Use a humidifier in your home.  Run a hot shower to create steam in the bathroom. Sit in the bathroom with the door closed. Breathe in the steam 3-4 times a day.  Put a warm, moist washcloth on your face 3-4 times a day, or as told by your doctor.  Use salt water sprays (saline sprays) to wet the thick fluid in your nose. This can help the sinuses drain.  Only take medicine as told by your doctor. GET HELP RIGHT AWAY IF:   Your pain gets worse.  You have very bad headaches.  You are sick to your stomach (nauseous).  You throw up (vomit).  You are very sleepy (drowsy) all the time.  Your face is puffy (swollen).  Your vision changes.  You have a stiff neck.  You have trouble breathing. MAKE SURE YOU:   Understand these instructions.  Will watch your condition.  Will get help right away if you are not doing well or get worse.   This information is not intended to replace advice given to you by your health care provider. Make sure you discuss any questions you have with your health care provider.   Document Released: 08/15/2007 Document Revised: 03/19/2014 Document Reviewed: 10/02/2011 Elsevier Interactive Patient Education 2016 [employer].  IF you received an x-ray today, you will receive an invoice from [facility]. Please contact [facility] at [phone] with questions or concerns regarding your invoice.   IF you  received labwork today, you will receive an invoice from [facility]. Please contact [facility] at [phone] with questions or concerns regarding your invoice.   Our billing staff will not be able to assist you with questions regarding bills from these companies.  You will be contacted with the lab results as soon as they are available. The fastest way to get your results is to activate your My Chart account. Instructions are located on the last page of this paperwork. If you have not heard from us regarding the results in 2 weeks, please contact this office.

## 2015-12-30 NOTE — Progress Notes (Signed)
MRN: [id number] DOB: 31-Aug-1960  Subjective:   Patty Martinez is a 55 y.o. female presenting for chief complaint of Sinusitis . Reports 6 day history of sinus headache, sinus congestion, sinus pain, ear fullness and dry cough (no hemoptysis), . Has tried sudaphed with no relief. Denies fever, itchy watery eyes, red eyes, sore throat, shortness of breath, chest tightness, chest pain and myalgia, night sweats, chills, fatigue, malaise, decreased appetite, nausea, vomiting, abdominal pain and diarrhea. Has not had sick contact with anyone. No history of seasonal allergies, history of asthma. Patient has had flu shot this season. Pt does have a history of sinus infections. She typically has two in one year.  Denies smoking. Denies any other aggravating or relieving factors, no other questions or concerns.  Patty Martinez has a current medication list which includes the following prescription(s): calcium citrate-vitamin d, ibuprofen, and pravastatin. Also has No Known Allergies.  Patty Martinez  has a past medical history of Arthritis. Also  has a past surgical history that includes shoulder surgery (2010) and Knee surgery.  Objective:   Vitals: BP 122/72 (BP Location: Right Arm, Patient Position: Sitting, Cuff Size: Normal)   Pulse 80   Temp 98.5 F (36.9 C) (Oral)   Resp 17   Ht 5' 7.5" (1.715 m)   Wt 144 lb (65.3 kg)   LMP 11/14/2012   SpO2 98%   BMI 22.22 kg/m   Physical Exam  Constitutional: She is oriented to person, place, and time. She appears well-developed and well-nourished. She appears distressed (mild).  HENT:  Head: Normocephalic and atraumatic.  Right Ear: Tympanic membrane, external ear and ear canal normal.  Left Ear: External ear and ear canal normal. Tympanic membrane is injected ( cone of light distorted).  Nose: Mucosal edema ( more prominent on left side ) present. Right sinus exhibits no maxillary sinus tenderness and no frontal sinus tenderness. Left sinus exhibits maxillary sinus  tenderness ( with palpation and with tilting head foward ) and frontal sinus tenderness ( with palpation and with tilting head foward ).  Mouth/Throat: Uvula is midline and mucous membranes are normal. Posterior oropharyngeal erythema present.  Pulmonary/Chest: Effort normal and breath sounds normal.  Lymphadenopathy:       Head (right side): No submental, no submandibular, no tonsillar, no preauricular, no posterior auricular and no occipital adenopathy present.       Head (left side): Submandibular adenopathy present. No submental, no tonsillar, no preauricular, no posterior auricular and no occipital adenopathy present.    She has no cervical adenopathy.       Right: No supraclavicular adenopathy present.       Left: No supraclavicular adenopathy present.  Neurological: She is alert and oriented to person, place, and time.  Skin: Skin is warm and dry.   No results found for this or any previous visit (from the past 24 hour(s)).  Assessment and Plan :  1. Acute non-recurrent ethmoidal sinusitis -Will cover for bacteria due to patient's history and physical exam findings  - amoxicillin-clavulanate (AUGMENTIN) 875-125 MG tablet; Take 1 tablet by mouth 2 (two) times daily.  Dispense: 14 tablet; Refill: 0  2. Nasal congestion - fluticasone (FLONASE) 50 MCG/ACT nasal spray; Place 2 sprays into both nostrils daily.  Dispense: 16 g; Refill: 0  3. Cough - benzonatate (TESSALON) 100 MG capsule; Take 1-2 capsules (100-200 mg total) by mouth 3 (three) times daily as needed for cough.  Dispense: 40 capsule; Refill: 0  -Return to clinic if symptoms worsen, do  not improve, or as needed  Patty CoreBrittany Gil Ingwersen, PA-C  [facility] 12/30/2015 10:03 AM

## 2016-05-05 ENCOUNTER — Ambulatory Visit (INDEPENDENT_AMBULATORY_CARE_PROVIDER_SITE_OTHER): Payer: Medicare Other | Admitting: Physician Assistant

## 2016-05-05 VITALS — BP 130/80 | HR 60 | Temp 98.6°F | Ht 67.5 in | Wt 145.6 lb

## 2016-05-05 DIAGNOSIS — E785 Hyperlipidemia, unspecified: Secondary | ICD-10-CM

## 2016-05-05 DIAGNOSIS — G8929 Other chronic pain: Secondary | ICD-10-CM | POA: Diagnosis not present

## 2016-05-05 DIAGNOSIS — M62838 Other muscle spasm: Secondary | ICD-10-CM

## 2016-05-05 DIAGNOSIS — M542 Cervicalgia: Secondary | ICD-10-CM | POA: Diagnosis not present

## 2016-05-05 MED ORDER — CYCLOBENZAPRINE HCL 10 MG PO TABS: 10.0000 mg | ORAL_TABLET | Freq: Three times a day (TID) | ORAL | 0 refills | Status: DC | PRN

## 2016-05-05 MED ORDER — NAPROXEN 500 MG PO TABS: 500.0000 mg | ORAL_TABLET | Freq: Two times a day (BID) | ORAL | 0 refills | Status: DC

## 2016-05-05 MED ORDER — MELOXICAM 15 MG PO TABS: 15.0000 mg | ORAL_TABLET | Freq: Every day | ORAL | 0 refills | Status: DC

## 2016-05-05 NOTE — Progress Notes (Signed)
Patty Martinez  MRN: [id number] DOB: [birth year]  Subjective:  Patty Martinez is a 56 y.o. female seen in office today for a chief complaint of neck pain x 3 weeks. She had plain films and MRI 2 years ago and she states they told her it was "bone on bone, compressing the nerve." Her orthopedic surgeon informed her she would eventually need surgery but she is not ready for that right now. She denies numbness, tingling, and weakness. Has tried massage which helps and physical therapy which did not help as much.   She also needs blood work for HLD. She is controlled on pravastatin 40mg .   Review of Systems  Constitutional: Negative for chills, diaphoresis and fever.  Neurological: Negative for dizziness, light-headedness and headaches.    Patient Active Problem List   Diagnosis Date Noted  . Hyperlipidemia 05/13/2014    Current Outpatient Prescriptions on File Prior to Visit  Medication Sig Dispense Refill  . Calcium Carbonate-Vitamin D (CALCIUM + D PO) Take 1 tablet by mouth daily. Reported on 07/04/2015    . ibuprofen (ADVIL) 200 MG tablet Take 200 mg by mouth every 6 (six) hours as needed. Reported on 04/29/2015    . pravastatin (PRAVACHOL) 40 MG tablet Take 1 tablet (40 mg total) by mouth daily. 90 tablet 3  . benzonatate (TESSALON) 100 MG capsule Take 1-2 capsules (100-200 mg total) by mouth 3 (three) times daily as needed for cough. (Patient not taking: Reported on 05/05/2016) 40 capsule 0  . fluticasone (FLONASE) 50 MCG/ACT nasal spray Place 2 sprays into both nostrils daily. (Patient not taking: Reported on 05/05/2016) 16 g 0   No current facility-administered medications on file prior to visit.     No Known Allergies   Objective:  BP 130/80 (BP Location: Right Arm, Patient Position: Sitting, Cuff Size: Small)   Pulse 60   Temp 98.6 F (37 C) (Oral)   Ht 5' 7.5" (1.715 m)   Wt 145 lb 9.6 oz (66 kg)   LMP 11/14/2012   SpO2 98%   BMI 22.47 kg/m   Physical Exam    Constitutional: She is oriented to person, place, and time and well-developed, well-nourished, and in no distress.  HENT:  Head: Normocephalic and atraumatic.  Eyes: Conjunctivae are normal.  Neck: Muscular tenderness (and spasm noted along left sided musculature) present. No spinous process tenderness present. No neck rigidity. Decreased range of motion (with rotation to the left) present. No erythema present.  Pulmonary/Chest: Effort normal.  Neurological: She is alert and oriented to person, place, and time. Gait normal.  Skin: Skin is warm and dry.  Psychiatric: Affect normal.  Vitals reviewed.   Assessment and Plan :  1. Hyperlipidemia, unspecified hyperlipidemia type Pt would like to decrease pravastatin dose. Informed her that if she is still well controlled, we can consider decreasing dose at next visit and see how she tolerates the decrease in dose.  - Lipid panel  2. Neck muscle spasm -Given educational material on neck stretches. Apply heat to affected area.  - cyclobenzaprine (FLEXERIL) 10 MG tablet; Take 1 tablet (10 mg total) by mouth 3 (three) times daily as needed for muscle spasms.  Dispense: 30 tablet; Refill: 0  3. Neck pain, chronic -We discussed that due to the chronicity of her neck pain, treatment options usually only provide short term relief. She understands and states that she will go see her orthopedic surgeon if she cannot get the relief she is wanting from the medication I prescribed.  -  naproxen (NAPROSYN) 500 MG tablet; Take 1 tablet (500 mg total) by mouth 2 (two) times daily with a meal.  Dispense: 30 tablet; Refill: 0   Benjiman CoreBrittany Jermy Couper PA-C  [facility] 05/05/2016 8:10 AM

## 2016-05-05 NOTE — Patient Instructions (Addendum)
For neck pain, take naproxen twice a day and flexeril up to three times a day. Flexeril can make you drowsy so try taking it first at night and then see how drowsy it makes you. Continue applying heat to the affected area daily. When you can get a massage, get one because this is known to help with pain. If you are not having any relief in 2 weeks with the medication, return to our clinic. Otherwise, I will see you in 6 months.   Thank you for letting me participate in your health and well being.     IF you received an x-ray today, you will receive an invoice from [facility] Radiology. Please contact [facility] Radiology at [phone] with questions or concerns regarding your invoice.   IF you received labwork today, you will receive an invoice from [facility]. Please contact [facility] at [phone] with questions or concerns regarding your invoice.   Our billing staff will not be able to assist you with questions regarding bills from these companies.  You will be contacted with the lab results as soon as they are available. The fastest way to get your results is to activate your My Chart account. Instructions are located on the last page of this paperwork. If you have not heard from [place] regarding the results in 2 weeks, please contact this office.      Cervical Radiculopathy Introduction Cervical radiculopathy means that a nerve in the neck is pinched or bruised. This can cause pain or loss of feeling (numbness) that runs from your neck to your arm and fingers. Follow these instructions at home: Managing pain  Take over-the-counter and prescription medicines only as told by your doctor.  If directed, put ice on the injured or painful area.  Put ice in a plastic bag.  Place a towel between your skin and the bag.  Leave the ice on for 20 minutes, 2-3 times per day.  If ice does not help, you can try using heat. Take a warm shower or warm bath, or use a heat pack as told by your  doctor.  You may try a gentle neck and shoulder massage. Activity  Rest as needed. Follow instructions from your doctor about any activities to avoid.  Do exercises as told by your doctor or physical therapist. General instructions  If you were given a soft collar, wear it as told by your doctor.  Use a flat pillow when you sleep.  Keep all follow-up visits as told by your doctor. This is important. Contact a doctor if:  Your condition does not improve with treatment. Get help right away if:  Your pain gets worse and is not controlled with medicine.  You lose feeling or feel weak in your hand, arm, face, or leg.  You have a fever.  You have a stiff neck.  You cannot control when you poop or pee (have incontinence).  You have trouble with walking, balance, or talking. This information is not intended to replace advice given to you by your health care provider. Make sure you discuss any questions you have with your health care provider. Document Released: 02/15/2011 Document Revised: 08/04/2015 Document Reviewed: 04/22/2014  2017 Elsevier    Cervical Strain and Sprain Rehab Ask your health care provider which exercises are safe for you. Do exercises exactly as told by your health care provider and adjust them as directed. It is normal to feel mild stretching, pulling, tightness, or discomfort as you do these exercises, but you should  stop right away if you feel sudden pain or your pain gets worse.Do not begin these exercises until told by your health care provider. Stretching and range of motion exercises These exercises warm up your muscles and joints and improve the movement and flexibility of your neck. These exercises also help to relieve pain, numbness, and tingling. Exercise A: Cervical side bend 1. Using good posture, sit on a stable chair or stand up. 2. Without moving your shoulders, slowly tilt your left / right ear to your shoulder until you feel a stretch in  your neck muscles. You should be looking straight ahead. 3. Hold for __________ seconds. 4. Repeat with the other side of your neck. Repeat __________ times. Complete this exercise __________ times a day. Exercise B: Cervical rotation 1. Using good posture, sit on a stable chair or stand up. 2. Slowly turn your head to the side as if you are looking over your left / right shoulder.  Keep your eyes level with the ground.  Stop when you feel a stretch along the side and the back of your neck. 3. Hold for __________ seconds. 4. Repeat this by turning to your other side. Repeat __________ times. Complete this exercise __________ times a day. Exercise C: Thoracic extension and pectoral stretch 1. Roll a towel or a small blanket so it is about 4 inches (10 cm) in diameter. 2. Lie down on your back on a firm surface. 3. Put the towel lengthwise, under your spine in the middle of your back. It should not be not under your shoulder blades. The towel should line up with your spine from your middle back to your lower back. 4. Put your hands behind your head and let your elbows fall out to your sides. 5. Hold for __________ seconds. Repeat __________ times. Complete this exercise __________ times a day. Strengthening exercises These exercises build strength and endurance in your neck. Endurance is the ability to use your muscles for a long time, even after your muscles get tired. Exercise D: Upper cervical flexion, isometric 1. Lie on your back with a thin pillow behind your head and a small rolled-up towel under your neck. 2. Gently tuck your chin toward your chest and nod your head down to look toward your feet. Do not lift your head off the pillow. 3. Hold for __________ seconds. 4. Release the tension slowly. Relax your neck muscles completely before you repeat this exercise. Repeat __________ times. Complete this exercise __________ times a day. Exercise E: Cervical extension,  isometric 1. Stand about 6 inches (15 cm) away from a wall, with your back facing the wall. 2. Place a soft object, about 6-8 inches (15-20 cm) in diameter, between the back of your head and the wall. A soft object could be a small pillow, a ball, or a folded towel. 3. Gently tilt your head back and press into the soft object. Keep your jaw and forehead relaxed. 4. Hold for __________ seconds. 5. Release the tension slowly. Relax your neck muscles completely before you repeat this exercise. Repeat __________ times. Complete this exercise __________ times a day. Posture and body mechanics   Body mechanics refers to the movements and positions of your body while you do your daily activities. Posture is part of body mechanics. Good posture and healthy body mechanics can help to relieve stress in your body's tissues and joints. Good posture means that your spine is in its natural S-curve position (your spine is neutral), your shoulders are pulled  back slightly, and your head is not tipped forward. The following are general guidelines for applying improved posture and body mechanics to your everyday activities. Standing  When standing, keep your spine neutral and keep your feet about hip-width apart. Keep a slight bend in your knees. Your ears, shoulders, and hips should line up.  When you do a task in which you stand in one place for a long time, place one foot up on a stable object that is 2-4 inches (5-10 cm) high, such as a footstool. This helps keep your spine neutral. Sitting  When sitting, keep your spine neutral and your keep feet flat on the floor. Use a footrest, if necessary, and keep your thighs parallel to the floor. Avoid rounding your shoulders, and avoid tilting your head forward.  When working at a desk or a computer, keep your desk at a height where your hands are slightly lower than your elbows. Slide your chair under your desk so you are close enough to maintain good  posture.  When working at a computer, place your monitor at a height where you are looking straight ahead and you do not have to tilt your head forward or downward to look at the screen. Resting When lying down and resting, avoid positions that are most painful for you. Try to support your neck in a neutral position. You can use a contour pillow or a small rolled-up towel. Your pillow should support your neck but not push on it. This information is not intended to replace advice given to you by your health care provider. Make sure you discuss any questions you have with your health care provider. Document Released: 02/26/2005 Document Revised: 11/03/2015 Document Reviewed: 02/02/2015 Elsevier Interactive Patient Education  2017 [employer]

## 2016-05-06 LAB — LIPID PANEL
CHOL/HDL RATIO: 2.8 ratio (ref 0.0–4.4)
Cholesterol, Total: 180 mg/dL (ref 100–199)
HDL: 65 mg/dL (ref >39)
LDL Calculated: 104 mg/dL — ABNORMAL HIGH (ref 0–99)
Triglycerides: 53 mg/dL (ref 0–149)
VLDL CHOLESTEROL CAL: 11 mg/dL (ref 5–40)

## 2016-05-16 ENCOUNTER — Other Ambulatory Visit: Payer: Self-pay | Admitting: Family Medicine

## 2016-05-16 DIAGNOSIS — E785 Hyperlipidemia, unspecified: Secondary | ICD-10-CM

## 2016-09-14 ENCOUNTER — Ambulatory Visit (INDEPENDENT_AMBULATORY_CARE_PROVIDER_SITE_OTHER): Payer: Medicare Other

## 2016-09-14 ENCOUNTER — Encounter: Payer: Self-pay | Admitting: Physician Assistant

## 2016-09-14 ENCOUNTER — Ambulatory Visit (INDEPENDENT_AMBULATORY_CARE_PROVIDER_SITE_OTHER): Payer: Medicare Other | Admitting: Physician Assistant

## 2016-09-14 VITALS — BP 118/76 | HR 63 | Temp 98.3°F | Resp 16 | Ht 66.0 in | Wt 143.4 lb

## 2016-09-14 DIAGNOSIS — M5136 Other intervertebral disc degeneration, lumbar region: Secondary | ICD-10-CM | POA: Diagnosis not present

## 2016-09-14 DIAGNOSIS — M545 Low back pain, unspecified: Secondary | ICD-10-CM

## 2016-09-14 DIAGNOSIS — M62830 Muscle spasm of back: Secondary | ICD-10-CM

## 2016-09-14 DIAGNOSIS — M51369 Other intervertebral disc degeneration, lumbar region without mention of lumbar back pain or lower extremity pain: Secondary | ICD-10-CM | POA: Insufficient documentation

## 2016-09-14 MED ORDER — NAPROXEN 500 MG PO TABS: 500.0000 mg | ORAL_TABLET | Freq: Two times a day (BID) | ORAL | 0 refills | Status: DC

## 2016-09-14 MED ORDER — CYCLOBENZAPRINE HCL 10 MG PO TABS: 10.0000 mg | ORAL_TABLET | Freq: Three times a day (TID) | ORAL | 0 refills | Status: DC | PRN

## 2016-09-14 MED ORDER — PREDNISONE 20 MG PO TABS: ORAL_TABLET | ORAL | 0 refills | Status: DC

## 2016-09-14 NOTE — Patient Instructions (Addendum)
Flexeril is a muscle relaxer. Please take this as directed. Use this at night to help you sleep.  Naproxen is an antiinflammatory. Do not use with any other otc pain medication other than tylenol/acetaminophen - so no aleve, ibuprofen, motrin, advil, etc.  Please use heat and/or ice as needed for pain. Do this for 20-30 minutes at at time 2-4 times a day.  Drink plenty of water: 1-3 liters/day.  Please see below for stretches. Do these daily once your pain is under control. Perform exercises below at 5 sets with 10 repetitions. Stretches are to be performed for 5 sets, 10 seconds each. Recommended she perform this rehab twice daily within pain tolerance for 2 weeks.   Come back in 4-6 weeks if you are not better. We can refer you to physical therapy or orthopedics.   Thank you for coming in today. I hope you feel we met your needs.  Feel free to call [facility] if you have any questions or further requests.  Please consider signing up for MyChart if you do not already have it, as this is a great way to communicate with me.  Best,  Whitney McVey, PA-C  Low Back Strain Rehab Ask your health care provider which exercises are safe for you. Do exercises exactly as told by your health care provider and adjust them as directed. It is normal to feel mild stretching, pulling, tightness, or discomfort as you do these exercises, but you should stop right away if you feel sudden pain or your pain gets worse. Do not begin these exercises until told by your health care provider. Stretching and range of motion exercises These exercises warm up your muscles and joints and improve the movement and flexibility of your back. These exercises also help to relieve pain, numbness, and tingling. Exercise A: Single knee to chest  1. Lie on your back on a firm surface with both legs straight. 2. Bend one of your knees. Use your hands to move your knee up toward your chest until you feel a gentle stretch in your lower back  and buttock. ? Hold your leg in this position by holding onto the front of your knee. ? Keep your other leg as straight as possible. 3. Hold for __________ seconds. 4. Slowly return to the starting position. 5. Repeat with your other leg. Repeat __________ times. Complete this exercise __________ times a day. Exercise B: Prone extension on elbows  1. Lie on your abdomen on a firm surface. 2. Prop yourself up on your elbows. 3. Use your arms to help lift your chest up until you feel a gentle stretch in your abdomen and your lower back. ? This will place some of your body weight on your elbows. If this is uncomfortable, try stacking pillows under your chest. ? Your hips should stay down, against the surface that you are lying on. Keep your hip and back muscles relaxed. 4. Hold for __________ seconds. 5. Slowly relax your upper body and return to the starting position. Repeat __________ times. Complete this exercise __________ times a day. Strengthening exercises These exercises build strength and endurance in your back. Endurance is the ability to use your muscles for a long time, even after they get tired. Exercise C: Pelvic tilt 1. Lie on your back on a firm surface. Bend your knees and keep your feet flat. 2. Tense your abdominal muscles. Tip your pelvis up toward the ceiling and flatten your lower back into the floor. ? To help with  this exercise, you may place a small towel under your lower back and try to push your back into the towel. 3. Hold for __________ seconds. 4. Let your muscles relax completely before you repeat this exercise. Repeat __________ times. Complete this exercise __________ times a day. Exercise D: Alternating arm and leg raises  1. Get on your hands and knees on a firm surface. If you are on a hard floor, you may want to use padding to cushion your knees, such as an exercise mat. 2. Line up your arms and legs. Your hands should be below your shoulders, and your  knees should be below your hips. 3. Lift your left leg behind you. At the same time, raise your right arm and straighten it in front of you. ? Do not lift your leg higher than your hip. ? Do not lift your arm higher than your shoulder. ? Keep your abdominal and back muscles tight. ? Keep your hips facing the ground. ? Do not arch your back. ? Keep your balance carefully, and do not hold your breath. 4. Hold for __________ seconds. 5. Slowly return to the starting position and repeat with your right leg and your left arm. Repeat __________ times. Complete this exercise __________times a day. Exercise J: Single leg lower with bent knees 1. Lie on your back on a firm surface. 2. Tense your abdominal muscles and lift your feet off the floor, one foot at a time, so your knees and hips are bent in an "L" shape (at about 90 degrees). ? Your knees should be over your hips and your lower legs should be parallel to the floor. 3. Keeping your abdominal muscles tense and your knee bent, slowly lower one of your legs so your toe touches the ground. 4. Lift your leg back up to return to the starting position. ? Do not hold your breath. ? Do not let your back arch. Keep your back flat against the ground. 5. Repeat with your other leg. Repeat __________ times. Complete this exercise __________ times a day. Posture and body mechanics  Body mechanics refers to the movements and positions of your body while you do your daily activities. Posture is part of body mechanics. Good posture and healthy body mechanics can help to relieve stress in your body's tissues and joints. Good posture means that your spine is in its natural S-curve position (your spine is neutral), your shoulders are pulled back slightly, and your head is not tipped forward. The following are general guidelines for applying improved posture and body mechanics to your everyday activities. Standing   When standing, keep your spine neutral and  your feet about hip-width apart. Keep a slight bend in your knees. Your ears, shoulders, and hips should line up.  When you do a task in which you stand in one place for a long time, place one foot up on a stable object that is 2-4 inches (5-10 cm) high, such as a footstool. This helps keep your spine neutral. Sitting   When sitting, keep your spine neutral and keep your feet flat on the floor. Use a footrest, if necessary, and keep your thighs parallel to the floor. Avoid rounding your shoulders, and avoid tilting your head forward.  When working at a desk or a computer, keep your desk at a height where your hands are slightly lower than your elbows. Slide your chair under your desk so you are close enough to maintain good posture.  When working at a  computer, place your monitor at a height where you are looking straight ahead and you do not have to tilt your head forward or downward to look at the screen. Resting   When lying down and resting, avoid positions that are most painful for you.  If you have pain with activities such as sitting, bending, stooping, or squatting (flexion-based activities), lie in a position in which your body does not bend very much. For example, avoid curling up on your side with your arms and knees near your chest (fetal position).  If you have pain with activities such as standing for a long time or reaching with your arms (extension-based activities), lie with your spine in a neutral position and bend your knees slightly. Try the following positions: ? Lying on your side with a pillow between your knees. ? Lying on your back with a pillow under your knees. Lifting   When lifting objects, keep your feet at least shoulder-width apart and tighten your abdominal muscles.  Bend your knees and hips and keep your spine neutral. It is important to lift using the strength of your legs, not your back. Do not lock your knees straight out.  Always ask for help to lift  heavy or awkward objects. This information is not intended to replace advice given to you by your health care provider. Make sure you discuss any questions you have with your health care provider. Document Released: 02/26/2005 Document Revised: 11/03/2015 Document Reviewed: 12/08/2014 Elsevier Interactive Patient Education  2018 [employer].  IF you received an x-ray today, you will receive an invoice from [facility] Radiology. Please contact [facility] Radiology at [phone] with questions or concerns regarding your invoice.   IF you received labwork today, you will receive an invoice from [facility]. Please contact [facility] at [phone] with questions or concerns regarding your invoice.   Our billing staff will not be able to assist you with questions regarding bills from these companies.  You will be contacted with the lab results as soon as they are available. The fastest way to get your results is to activate your My Chart account. Instructions are located on the last page of this paperwork. If you have not heard from [place] regarding the results in 2 weeks, please contact this office.    We recommend that you schedule a mammogram for breast cancer screening. Typically, you do not need a referral to do this. Please contact a local imaging center to schedule your mammogram.  [facility] - [phone]  *ask for the Radiology Department [facility] ([place]) - [phone] or [phone]  [facility] - [phone] [place] [phone] [facility] - [phone]  *ask for the [facility] - [phone]  *ask for the Radiology Department [facility] - [phone]  *ask for the [place] - [phone]

## 2016-09-14 NOTE — Progress Notes (Signed)
Patty Martinez  MRN: [id number] DOB: Jun 24, 1960  PCP: Magdalene RiverWiseman, Brittany D, PA-C  Subjective:  Pt is a pleasant 56 year old female PMH HLD who presents to clinic for back pain x 2 days.  She first noticed her back pain 2 nights ago. She went for a long walk that day - denies lifting/moving/strenuous activity prior to injury.   Her pain is located lower right back, is 10/10. Radiates down left leg "a little bit". She could not drive yesterday due to pain.   She tried applying cream, hot water, massage - not helping much.  Pain is worse with movement, standing and lying down.   She denies numbness to groin area, loss of bowel or bladder control, blood in urine, increased frequency or urgency with urination, burning with urination, muscle weakness.  She notes h/o back pain similar to this, however has not suffered from this for several years.   Review of Systems  Gastrointestinal: Negative for abdominal pain, constipation and diarrhea.  Genitourinary: Negative for dysuria, frequency, hematuria and urgency.  Musculoskeletal: Positive for back pain and gait problem.  Neurological: Negative for weakness and numbness.  Psychiatric/Behavioral: Positive for sleep disturbance.    Patient Active Problem List   Diagnosis Date Noted  . Hyperlipidemia 05/13/2014    Current Outpatient Prescriptions on File Prior to Visit  Medication Sig Dispense Refill  . pravastatin (PRAVACHOL) 40 MG tablet TAKE 1 TABLET(40 MG) BY MOUTH DAILY 90 tablet 1  . benzonatate (TESSALON) 100 MG capsule Take 1-2 capsules (100-200 mg total) by mouth 3 (three) times daily as needed for cough. (Patient not taking: Reported on 05/05/2016) 40 capsule 0  . Calcium Carbonate-Vitamin D (CALCIUM + D PO) Take 1 tablet by mouth daily. Reported on 07/04/2015    . cyclobenzaprine (FLEXERIL) 10 MG tablet Take 1 tablet (10 mg total) by mouth 3 (three) times daily as needed for muscle spasms. (Patient not taking: Reported on 09/14/2016) 30  tablet 0  . fluticasone (FLONASE) 50 MCG/ACT nasal spray Place 2 sprays into both nostrils daily. (Patient not taking: Reported on 05/05/2016) 16 g 0  . ibuprofen (ADVIL) 200 MG tablet Take 200 mg by mouth every 6 (six) hours as needed. Reported on 04/29/2015    . naproxen (NAPROSYN) 500 MG tablet Take 1 tablet (500 mg total) by mouth 2 (two) times daily with a meal. (Patient not taking: Reported on 09/14/2016) 30 tablet 0   No current facility-administered medications on file prior to visit.     No Known Allergies   Objective:  BP 118/76 (BP Location: Right Arm, Patient Position: Sitting, Cuff Size: Normal)   Pulse 63   Temp 98.3 F (36.8 C) (Oral)   Resp 16   Ht 5\' 6"  (1.676 m)   Wt 143 lb 6.4 oz (65 kg)   LMP 11/14/2012   SpO2 99%   BMI 23.15 kg/m   Physical Exam  Constitutional: She is oriented to person, place, and time and well-developed, well-nourished, and in no distress. No distress.  Cardiovascular: Normal rate, regular rhythm and normal heart sounds.   Musculoskeletal:       Lumbar back: She exhibits decreased range of motion (with forward flexion), tenderness and spasm (right side). She exhibits no bony tenderness and no deformity.  Neurological: She is alert and oriented to person, place, and time. She has normal sensation and normal strength. She displays no weakness. She has a normal Straight Leg Raise Test. Gait normal. GCS score is 15.  Skin: Skin  is warm and dry.  Psychiatric: Mood, memory, affect and judgment normal.  Vitals reviewed.   Dg Lumbar Spine Complete  Result Date: 09/14/2016 CLINICAL DATA:  Low back pain for 2 days, no known injury, initial encounter EXAM: LUMBAR SPINE - COMPLETE 4+ VIEW COMPARISON:  None. FINDINGS: Five lumbar type vertebral bodies are well visualized. Mild osteophytic changes are seen. Facet hypertrophic changes are noted. Disc space narrowing is noted at L4-5 and L5-S1. No anterolisthesis is seen. No soft tissue abnormality is noted.  IMPRESSION: Degenerative change without acute abnormality. Electronically Signed   By: Alcide Clever M.D.   On: 09/14/2016 11:25    Assessment and Plan :  1. Acute right-sided low back pain without sciatica 2. Degenerative disc disease, lumbar 3. Back muscle spasm - cyclobenzaprine (FLEXERIL) 10 MG tablet; Take 1 tablet (10 mg total) by mouth 3 (three) times daily as needed for muscle spasms.  Dispense: 30 tablet; Refill: 0 - naproxen (NAPROSYN) 500 MG tablet; Take 1 tablet (500 mg total) by mouth 2 (two) times daily with a meal.  Dispense: 30 tablet; Refill: 0 - DG Lumbar Spine Complete; Future - predniSONE (DELTASONE) 20 MG tablet; Take 3 PO QAM x3days, 2 PO QAM x3days, 1 PO QAM x3days  Dispense: 18 tablet; Refill: 0 - X-ray shows degenerative changes. Will treat supportively. Encouraged heat and stretching. RTC in 3-4 weeks if no improvement. Consider referral to physical therapy or ortho. She understands and agrees with plan.   Marco Collie, PA-C  Primary Care at [facility] 09/14/2016 10:44 AM

## 2016-11-06 DIAGNOSIS — Z1231 Encounter for screening mammogram for malignant neoplasm of breast: Secondary | ICD-10-CM | POA: Diagnosis not present

## 2016-11-06 DIAGNOSIS — Z124 Encounter for screening for malignant neoplasm of cervix: Secondary | ICD-10-CM | POA: Diagnosis not present

## 2016-11-06 DIAGNOSIS — Z01419 Encounter for gynecological examination (general) (routine) without abnormal findings: Secondary | ICD-10-CM | POA: Diagnosis not present

## 2016-11-13 ENCOUNTER — Telehealth: Payer: Self-pay | Admitting: Physician Assistant

## 2016-11-13 ENCOUNTER — Ambulatory Visit: Payer: Medicare Other | Admitting: Physician Assistant

## 2016-11-13 ENCOUNTER — Encounter: Payer: Self-pay | Admitting: Physician Assistant

## 2016-11-13 ENCOUNTER — Ambulatory Visit (INDEPENDENT_AMBULATORY_CARE_PROVIDER_SITE_OTHER): Payer: Medicare Other | Admitting: Physician Assistant

## 2016-11-13 VITALS — BP 131/74 | HR 65 | Temp 98.0°F | Resp 16 | Ht 66.0 in | Wt 142.8 lb

## 2016-11-13 DIAGNOSIS — R05 Cough: Secondary | ICD-10-CM

## 2016-11-13 DIAGNOSIS — Z130 Encounter for screening for diseases of the blood and blood-forming organs and certain disorders involving the immune mechanism: Secondary | ICD-10-CM | POA: Diagnosis not present

## 2016-11-13 DIAGNOSIS — Z23 Encounter for immunization: Secondary | ICD-10-CM | POA: Diagnosis not present

## 2016-11-13 DIAGNOSIS — Z13220 Encounter for screening for lipoid disorders: Secondary | ICD-10-CM | POA: Diagnosis not present

## 2016-11-13 DIAGNOSIS — E785 Hyperlipidemia, unspecified: Secondary | ICD-10-CM | POA: Diagnosis not present

## 2016-11-13 DIAGNOSIS — Z131 Encounter for screening for diabetes mellitus: Secondary | ICD-10-CM

## 2016-11-13 DIAGNOSIS — R0981 Nasal congestion: Secondary | ICD-10-CM | POA: Diagnosis not present

## 2016-11-13 DIAGNOSIS — Z1329 Encounter for screening for other suspected endocrine disorder: Secondary | ICD-10-CM | POA: Diagnosis not present

## 2016-11-13 DIAGNOSIS — R059 Cough, unspecified: Secondary | ICD-10-CM

## 2016-11-13 MED ORDER — HYDROCODONE-HOMATROPINE 5-1.5 MG/5ML PO SYRP: 5.0000 mL | ORAL_SOLUTION | Freq: Three times a day (TID) | ORAL | 0 refills | Status: DC | PRN

## 2016-11-13 MED ORDER — PRAVASTATIN SODIUM 40 MG PO TABS: ORAL_TABLET | ORAL | 1 refills | Status: DC

## 2016-11-13 MED ORDER — LORATADINE-PSEUDOEPHEDRINE ER 10-240 MG PO TB24: 1.0000 | ORAL_TABLET | Freq: Every day | ORAL | 1 refills | Status: DC

## 2016-11-13 NOTE — Progress Notes (Signed)
Patty Martinez  MRN: [id number] DOB: 03/19/1960  PCP: Magdalene RiverWiseman, Brittany D, PA-C  Subjective:  Pt is a 56 year old female who presents to clinic for nasal congestion x 3 days. C/o pressure in her head and sinuses and some sneezing. Cough is non productive and is keeping her up at night. She denies fever, chills, n/v, muscle aches, shob, chest pain.    HLD - controlled with pravastatin 40mg . Last labs drawn 6 months ago. She is interested in lowering her dose. She is requesting to be screened "for everything" today. She is aware her insurance may not pay for labs and she feels fine about this.   Review of Systems  Constitutional: Negative for chills, diaphoresis, fatigue and fever.  HENT: Positive for congestion, rhinorrhea, sinus pressure and sneezing. Negative for postnasal drip and sore throat.   Respiratory: Positive for cough. Negative for chest tightness, shortness of breath and wheezing.   Cardiovascular: Negative for chest pain and palpitations.  Gastrointestinal: Negative for abdominal pain, diarrhea, nausea and vomiting.  Neurological: Positive for headaches. Negative for weakness and light-headedness.  Psychiatric/Behavioral: Positive for sleep disturbance.    Patient Active Problem List   Diagnosis Date Noted  . Degenerative disc disease, lumbar 09/14/2016  . Hyperlipidemia 05/13/2014    Current Outpatient Prescriptions on File Prior to Visit  Medication Sig Dispense Refill  . pravastatin (PRAVACHOL) 40 MG tablet TAKE 1 TABLET(40 MG) BY MOUTH DAILY 90 tablet 1  . cyclobenzaprine (FLEXERIL) 10 MG tablet Take 1 tablet (10 mg total) by mouth 3 (three) times daily as needed for muscle spasms. (Patient not taking: Reported on 11/13/2016) 30 tablet 0  . naproxen (NAPROSYN) 500 MG tablet Take 1 tablet (500 mg total) by mouth 2 (two) times daily with a meal. (Patient not taking: Reported on 11/13/2016) 30 tablet 0   No current facility-administered medications on file prior to visit.      No Known Allergies   Objective:  BP 131/74   Pulse 65   Temp 98 F (36.7 C) (Oral)   Resp 16   Ht 5\' 6"  (1.676 m)   Wt 142 lb 12.8 oz (64.8 kg)   LMP 11/14/2012   SpO2 98%   BMI 23.05 kg/m   Physical Exam  Constitutional: She is oriented to person, place, and time and well-developed, well-nourished, and in no distress. No distress.  HENT:  Right Ear: Tympanic membrane normal.  Left Ear: Tympanic membrane normal.  Nose: Mucosal edema and rhinorrhea present. Right sinus exhibits no maxillary sinus tenderness and no frontal sinus tenderness. Left sinus exhibits no maxillary sinus tenderness and no frontal sinus tenderness.  Mouth/Throat: Uvula is midline, oropharynx is clear and moist and mucous membranes are normal.  Cardiovascular: Normal rate, regular rhythm and normal heart sounds.   Pulmonary/Chest: Effort normal and breath sounds normal. No respiratory distress. She has no wheezes.  Neurological: She is alert and oriented to person, place, and time. GCS score is 15.  Skin: Skin is warm and dry.  Psychiatric: Mood, memory, affect and judgment normal.  Vitals reviewed.  Lab Results  Component Value Date   CHOL 180 05/05/2016   HDL 65 05/05/2016   LDLCALC 104 (H) 05/05/2016   TRIG 53 05/05/2016   CHOLHDL 2.8 05/05/2016    Assessment and Plan :  1. Nasal congestion 2. Cough - loratadine-pseudoephedrine (CLARITIN-D 24 HOUR) 10-240 MG 24 hr tablet; Take 1 tablet by mouth daily.  Dispense: 60 tablet; Refill: 1 - HYDROcodone-homatropine (HYCODAN) 5-1.5 MG/5ML syrup;  Take 5 mLs by mouth every 8 (eight) hours as needed for cough.  Dispense: 120 mL; Refill: 0  3. Hyperlipidemia, unspecified hyperlipidemia type 4. Screening, lipid - pravastatin (PRAVACHOL) 40 MG tablet; TAKE 1 TABLET(40 MG) BY MOUTH DAILY  Dispense: 90 tablet; Refill: 1 - Lipid panel  5. Screening for diabetes mellitus - Basic metabolic panel  6. Screening for deficiency anemia - CBC  7. Need for  prophylactic vaccination and inoculation against influenza - Flu Vaccine QUAD 36+ mos IM   Marco Collie, PA-C  Primary Care at [facility] 11/13/2016 8:21 AM

## 2016-11-13 NOTE — Patient Instructions (Addendum)
We will contact you with the results of your lab work.  Continue taking '40mg'$  Pravastatin.  Come back in 6 months and see [place].   Stay well hydrated - drink 1-3 liters of water daily.  Claritin-D - take this daily until you are feeling better.  Hycodan - this is a Hydrocodone-based medication. Take this only as prescribed. Do not drive while take this medication.  Use the nasal spray [place] prescribed for you.  Nasal rinses may help you clear your congestion. Try using a Neti pot.  Come back if you are not better in 5-7 days   Thank you for coming in today. I hope you feel we met your needs.  Feel free to call [facility] if you have any questions or further requests.  Please consider signing up for MyChart if you do not already have it, as this is a great way to communicate with me.  Best,  Whitney McVey, PA-C  IF you received an x-ray today, you will receive an invoice from [facility]. Please contact [facility] at [phone] with questions or concerns regarding your invoice.   IF you received labwork today, you will receive an invoice from [facility]. Please contact [facility] at [phone] with questions or concerns regarding your invoice.   Our billing staff will not be able to assist you with questions regarding bills from these companies.  You will be contacted with the lab results as soon as they are available. The fastest way to get your results is to activate your My Chart account. Instructions are located on the last page of this paperwork. If you have not heard from [place] regarding the results in 2 weeks, please contact this office.    Influenza (Flu) Vaccine (Inactivated or Recombinant): What You Need to Know 1. Why get vaccinated? Influenza ("flu") is a contagious disease that spreads around the [place] every year, usually between October and May. Flu is caused by influenza viruses, and is spread mainly by coughing, sneezing, and close  contact. Anyone can get flu. Flu strikes suddenly and can last several days. Symptoms vary by age, but can include:  fever/chills  sore throat  muscle aches  fatigue  cough  headache  runny or stuffy nose  Flu can also lead to pneumonia and blood infections, and cause diarrhea and seizures in children. If you have a medical condition, such as heart or lung disease, flu can make it worse. Flu is more dangerous for some people. Infants and young children, people 14 years of age and older, pregnant women, and people with certain health conditions or a weakened immune system are at greatest risk. Each year thousands of people in the [place] die from flu, and many more are hospitalized. Flu vaccine can:  keep you from getting flu,  make flu less severe if you do get it, and  keep you from spreading flu to your family and other people. 2. Inactivated and recombinant flu vaccines A dose of flu vaccine is recommended every flu season. Children 6 months through 37 years of age may need two doses during the same flu season. Everyone else needs only one dose each flu season. Some inactivated flu vaccines contain a very small amount of a mercury-based preservative called thimerosal. Studies have not shown thimerosal in vaccines to be harmful, but flu vaccines that do not contain thimerosal are available. There is no live flu virus in flu shots. They cannot cause the flu. There are many flu viruses, and they  are always changing. Each year a new flu vaccine is made to protect against three or four viruses that are likely to cause disease in the upcoming flu season. But even when the vaccine doesn't exactly match these viruses, it may still provide some protection. Flu vaccine cannot prevent:  flu that is caused by a virus not covered by the vaccine, or  illnesses that look like flu but are not.  It takes about 2 weeks for protection to develop after vaccination, and protection lasts  through the flu season. 3. Some people should not get this vaccine Tell the person who is giving you the vaccine:  If you have any severe, life-threatening allergies. If you ever had a life-threatening allergic reaction after a dose of flu vaccine, or have a severe allergy to any part of this vaccine, you may be advised not to get vaccinated. Most, but not all, types of flu vaccine contain a small amount of egg protein.  If you ever had Guillain-Barr Syndrome (also called GBS). Some people with a history of GBS should not get this vaccine. This should be discussed with your doctor.  If you are not feeling well. It is usually okay to get flu vaccine when you have a mild illness, but you might be asked to come back when you feel better.  4. Risks of a vaccine reaction With any medicine, including vaccines, there is a chance of reactions. These are usually mild and go away on their own, but serious reactions are also possible. Most people who get a flu shot do not have any problems with it. Minor problems following a flu shot include:  soreness, redness, or swelling where the shot was given  hoarseness  sore, red or itchy eyes  cough  fever  aches  headache  itching  fatigue  If these problems occur, they usually begin soon after the shot and last 1 or 2 days. More serious problems following a flu shot can include the following:  There may be a small increased risk of Guillain-Barre Syndrome (GBS) after inactivated flu vaccine. This risk has been estimated at 1 or 2 additional cases per million people vaccinated. This is much lower than the risk of severe complications from flu, which can be prevented by flu vaccine.  Young children who get the flu shot along with pneumococcal vaccine (PCV13) and/or DTaP vaccine at the same time might be slightly more likely to have a seizure caused by fever. Ask your doctor for more information. Tell your doctor if a child who is getting flu  vaccine has ever had a seizure.  Problems that could happen after any injected vaccine:  People sometimes faint after a medical procedure, including vaccination. Sitting or lying down for about 15 minutes can help prevent fainting, and injuries caused by a fall. Tell your doctor if you feel dizzy, or have vision changes or ringing in the ears.  Some people get severe pain in the shoulder and have difficulty moving the arm where a shot was given. This happens very rarely.  Any medication can cause a severe allergic reaction. Such reactions from a vaccine are very rare, estimated at about 1 in a million doses, and would happen within a few minutes to a few hours after the vaccination. As with any medicine, there is a very remote chance of a vaccine causing a serious injury or death. The safety of vaccines is always being monitored. For more information, visit: http://www.aguilar.org/ 5. What if there is a  serious reaction? What should I look for? Look for anything that concerns you, such as signs of a severe allergic reaction, very high fever, or unusual behavior. Signs of a severe allergic reaction can include hives, swelling of the face and throat, difficulty breathing, a fast heartbeat, dizziness, and weakness. These would start a few minutes to a few hours after the vaccination. What should I do?  If you think it is a severe allergic reaction or other emergency that can't wait, call 9-1-1 and get the person to the nearest hospital. Otherwise, call your doctor.  Reactions should be reported to the Vaccine Adverse Event Reporting System (VAERS). Your doctor should file this report, or you can do it yourself through the VAERS web site at www.vaers.SamedayNews.es, or by calling 340 503 4626. ? VAERS does not give medical advice. 6. The National Vaccine Injury Compensation Program The [employer] Vaccine Injury Compensation Program (VICP) is a federal program that was created to compensate people who  may have been injured by certain vaccines. Persons who believe they may have been injured by a vaccine can learn about the program and about filing a claim by calling 3233615910 or visiting the [place] website at GoldCloset.com.ee. There is a time limit to file a claim for compensation. 7. How can I learn more?  Ask your healthcare provider. He or she can give you the vaccine package insert or suggest other sources of information.  Call your local or state health department.  Contact the Centers for Disease Control and Prevention (CDC): ? Call 716-323-7657 (1-800-CDC-INFO) or ? Visit CDC's website at https://gibson.com/ Vaccine Information Statement, Inactivated Influenza Vaccine (10/16/2013) This information is not intended to replace advice given to you by your health care provider. Make sure you discuss any questions you have with your health care provider. Document Released: 12/21/2005 Document Revised: 11/17/2015 Document Reviewed: 11/17/2015 Elsevier Interactive Patient Education  2017 [employer].

## 2016-11-13 NOTE — Telephone Encounter (Signed)
Pt came back in to sign a form (advance beneficiary notice of noncoverage) gave pt a copy put another copy in scan box at 104 and gave a copy to the lab as [facility]

## 2016-11-14 LAB — BASIC METABOLIC PANEL
CO2: 23 mmol/L (ref 20–29)
GFR calc non Af Amer: 97 mL/min/{1.73_m2} (ref >59)
Glucose: 98 mg/dL (ref 65–99)
Potassium: 4.7 mmol/L (ref 3.5–5.2)
Sodium: 144 mmol/L (ref 134–144)

## 2016-11-14 LAB — LIPID PANEL
Chol/HDL Ratio: 3.2 ratio (ref 0.0–4.4)
Cholesterol, Total: 208 mg/dL — ABNORMAL HIGH (ref 100–199)
HDL: 65 mg/dL (ref >39)
LDL Calculated: 127 mg/dL — ABNORMAL HIGH (ref 0–99)
Triglycerides: 81 mg/dL (ref 0–149)
VLDL Cholesterol Cal: 16 mg/dL (ref 5–40)

## 2016-11-14 LAB — CBC
Hematocrit: 41 % (ref 34.0–46.6)
Hemoglobin: 13 g/dL (ref 11.1–15.9)
MCH: 30.1 pg (ref 26.6–33.0)
MCHC: 31.7 g/dL (ref 31.5–35.7)
MCV: 95 fL (ref 79–97)
Platelets: 250 10*3/uL (ref 150–379)
RBC: 4.32 x10E6/uL (ref 3.77–5.28)
RDW: 13.5 % (ref 12.3–15.4)
WBC: 5 10*3/uL (ref 3.4–10.8)

## 2016-11-14 LAB — BASIC METABOLIC PANEL WITH GFR
BUN/Creatinine Ratio: 16 (ref 9–23)
BUN: 11 mg/dL (ref 6–24)
Calcium: 9.5 mg/dL (ref 8.7–10.2)
Chloride: 104 mmol/L (ref 96–106)
Creatinine, Ser: 0.7 mg/dL (ref 0.57–1.00)
GFR calc Af Amer: 112 mL/min/{1.73_m2} (ref >59)

## 2016-12-07 ENCOUNTER — Telehealth: Payer: Self-pay

## 2016-12-07 NOTE — Telephone Encounter (Signed)
Called pt to schedule Medicare Annual Wellness Visit. -nr  

## 2016-12-11 ENCOUNTER — Ambulatory Visit (INDEPENDENT_AMBULATORY_CARE_PROVIDER_SITE_OTHER): Payer: Medicare Other

## 2016-12-11 VITALS — BP 122/80 | HR 79 | Ht 66.0 in | Wt 141.5 lb

## 2016-12-11 DIAGNOSIS — Z1159 Encounter for screening for other viral diseases: Secondary | ICD-10-CM | POA: Diagnosis not present

## 2016-12-11 DIAGNOSIS — Z0000 Encounter for general adult medical examination without abnormal findings: Secondary | ICD-10-CM

## 2016-12-11 DIAGNOSIS — Z114 Encounter for screening for human immunodeficiency virus [HIV]: Secondary | ICD-10-CM

## 2016-12-11 NOTE — Progress Notes (Signed)
Subjective:   Patty Martinez is a 56 y.o. female who presents for Medicare Annual (Subsequent) preventive examination.  Review of Systems:  N/A Cardiac Risk Factors include: dyslipidemia     Objective:     Vitals: BP 122/80   Pulse 79   Ht  (1.676 m)   Wt 141 lb 8 oz (64.2 kg)   LMP 11/14/2012   SpO2 98%   BMI 22.84 kg/m   Body mass index is 22.84 kg/m.   Tobacco History  Smoking Status  . Never Smoker  Smokeless Tobacco  . Never Used     Counseling given: Not Answered   Past Medical History:  Diagnosis Date  . Arthritis    Past Surgical History:  Procedure Laterality Date  . KNEE SURGERY    . shoulder surgery  2010   Family History  Problem Relation Age of Onset  . Cancer Father        throat  . Cancer - Lung Sister   . Colon cancer Neg Hx    History  Sexual Activity  . Sexual activity: Yes  . Partners: Male    Comment: monogamous with husbnad    Outpatient Encounter Prescriptions as of 12/11/2016  Medication Sig  . pravastatin (PRAVACHOL) 40 MG tablet TAKE 1 TABLET(40 MG) BY MOUTH DAILY  . [DISCONTINUED] HYDROcodone-homatropine (HYCODAN) 5-1.5 MG/5ML syrup Take 5 mLs by mouth every 8 (eight) hours as needed for cough.  . [DISCONTINUED] loratadine-pseudoephedrine (CLARITIN-D 24 HOUR) 10-240 MG 24 hr tablet Take 1 tablet by mouth daily.   No facility-administered encounter medications on file as of 12/11/2016.     Activities of Daily Living In your present state of health, do you have any difficulty performing the following activities: 12/11/2016  Hearing? N  Vision? Y  Comment Patient has to wear her glasses in order to see clearly  Difficulty concentrating or making decisions? N  Walking or climbing stairs? N  Dressing or bathing? N  Doing errands, shopping? N  Preparing Food and eating ? N  Using the Toilet? N  In the past six months, have you accidently leaked urine? N  Do you have problems with loss of bowel control? N  Managing  your Medications? N  Managing your Finances? N  Housekeeping or managing your Housekeeping? N  Some recent data might be hidden    Patient Care Team: Dwain Sarna as PCP - General (Physician Assistant)    Assessment:     Exercise Activities and Dietary recommendations Current Exercise Habits: The patient does not participate in regular exercise at present, Exercise limited by: None identified  Goals    . Exercise 3x per week (30 min per time)          Patient states that she will start trying to walk more often to help improve her exercise      Fall Risk Fall Risk  12/11/2016 11/13/2016 09/14/2016 05/05/2016 12/30/2015  Falls in the past year? No No No No No   Depression Screen PHQ 2/9 Scores 12/11/2016 11/13/2016 09/14/2016 05/05/2016  PHQ - 2 Score 6 0 0 0  PHQ- 9 Score 12 - - -     Cognitive Function     6CIT Screen 12/11/2016  What Year? 0 points  What month? 0 points  What time? 0 points  Count back from 20 0 points  Months in reverse 0 points  Repeat phrase 8 points  Total Score 8    Immunization History  Administered Date(s)  Administered  . Influenza,inj,Quad PF,6+ Mos 12/03/2013, 11/02/2014, 11/08/2015, 11/13/2016  . Tdap 11/17/2013   Screening Tests Health Maintenance  Topic Date Due  . Hepatitis C Screening  Jan 13, 1961  . HIV Screening  09/06/1975  . PAP SMEAR  12/18/2014  . MAMMOGRAM  07/26/2017  . TETANUS/TDAP  11/18/2023  . COLONOSCOPY  07/11/2025  . INFLUENZA VACCINE  Completed      Plan:   I have personally reviewed and noted the following in the patient's chart:   . Medical and social history . Use of alcohol, tobacco or illicit drugs  . Current medications and supplements . Functional ability and status . Nutritional status . Physical activity . Advanced directives . List of other physicians . Hospitalizations, surgeries, and ER visits in previous 12 months . Vitals . Screenings to include cognitive, depression, and  falls . Referrals and appointments  In addition, I have reviewed and discussed with patient certain preventive protocols, quality metrics, and best practice recommendations. A written personalized care plan for preventive services as well as general preventive health recommendations were provided to patient.  Bloodwork (Hep C and HIV) ordered.   Janalyn Shy, LPN  [id number]/[id number]

## 2016-12-11 NOTE — Patient Instructions (Addendum)
Ms. Audia , Thank you for taking time to come for your Medicare Wellness Visit. I appreciate your ongoing commitment to your health goals. Please review the following plan we discussed and let me know if I can assist you in the future.   Screening recommendations/referrals: Colonoscopy: up to date, next due 07/11/2025 Mammogram: up to date, next due 07/26/2017 Bone Density: starts at age 56 Recommended yearly ophthalmology/optometry visit for glaucoma screening and checkup Recommended yearly dental visit for hygiene and checkup  Vaccinations: Influenza vaccine: up to date, next due next season Pneumococcal vaccine: starts at age 23 Tdap vaccine: up to date, next due 11/18/2023 Shingles vaccine: Check with your pharmacy about receiving this  Advanced directives: Advance directive discussed with you today. Even though you declined this today please call our office should you change your mind and we can give you the proper paperwork for you to fill out.   Conditions/risks identified: Start trying to walk more often to help improve her exercise.  Next appointment: schedule follow up with PCP, 1 year for AWV   Preventive Care 40-64 Years, Female Preventive care refers to lifestyle choices and visits with your health care provider that can promote health and wellness. What does preventive care include?  A yearly physical exam. This is also called an annual well check.  Dental exams once or twice a year.  Routine eye exams. Ask your health care provider how often you should have your eyes checked.  Personal lifestyle choices, including:  Daily care of your teeth and gums.  Regular physical activity.  Eating a healthy diet.  Avoiding tobacco and drug use.  Limiting alcohol use.  Practicing safe sex.  Taking low-dose aspirin daily starting at age 47.  Taking vitamin and mineral supplements as recommended by your health care provider. What happens during an annual well  check? The services and screenings done by your health care provider during your annual well check will depend on your age, overall health, lifestyle risk factors, and family history of disease. Counseling  Your health care provider may ask you questions about your:  Alcohol use.  Tobacco use.  Drug use.  Emotional well-being.  Home and relationship well-being.  Sexual activity.  Eating habits.  Work and work Statistician.  Method of birth control.  Menstrual cycle.  Pregnancy history. Screening  You may have the following tests or measurements:  Height, weight, and BMI.  Blood pressure.  Lipid and cholesterol levels. These may be checked every 5 years, or more frequently if you are over 31 years old.  Skin check.  Lung cancer screening. You may have this screening every year starting at age 1 if you have a 30-pack-year history of smoking and currently smoke or have quit within the past 15 years.  Fecal occult blood test (FOBT) of the stool. You may have this test every year starting at age 51.  Flexible sigmoidoscopy or colonoscopy. You may have a sigmoidoscopy every 5 years or a colonoscopy every 10 years starting at age 71.  Hepatitis C blood test.  Hepatitis B blood test.  Sexually transmitted disease (STD) testing.  Diabetes screening. This is done by checking your blood sugar (glucose) after you have not eaten for a while (fasting). You may have this done every 1-3 years.  Mammogram. This may be done every 1-2 years. Talk to your health care provider about when you should start having regular mammograms. This may depend on whether you have a family history of breast cancer.  BRCA-related cancer screening. This may be done if you have a family history of breast, ovarian, tubal, or peritoneal cancers.  Pelvic exam and Pap test. This may be done every 3 years starting at age 57. Starting at age 67, this may be done every 5 years if you have a Pap test in  combination with an HPV test.  Bone density scan. This is done to screen for osteoporosis. You may have this scan if you are at high risk for osteoporosis. Discuss your test results, treatment options, and if necessary, the need for more tests with your health care provider. Vaccines  Your health care provider may recommend certain vaccines, such as:  Influenza vaccine. This is recommended every year.  Tetanus, diphtheria, and acellular pertussis (Tdap, Td) vaccine. You may need a Td booster every 10 years.  Zoster vaccine. You may need this after age 79.  Pneumococcal 13-valent conjugate (PCV13) vaccine. You may need this if you have certain conditions and were not previously vaccinated.  Pneumococcal polysaccharide (PPSV23) vaccine. You may need one or two doses if you smoke cigarettes or if you have certain conditions. Talk to your health care provider about which screenings and vaccines you need and how often you need them. This information is not intended to replace advice given to you by your health care provider. Make sure you discuss any questions you have with your health care provider. Document Released: 03/25/2015 Document Revised: 11/16/2015 Document Reviewed: 12/28/2014 Elsevier Interactive Patient Education  2017 [place] Prevention in the Home Falls can cause injuries. They can happen to people of all ages. There are many things you can do to make your home safe and to help prevent falls. What can I do on the outside of my home?  Regularly fix the edges of walkways and driveways and fix any cracks.  Remove anything that might make you trip as you walk through a door, such as a raised step or threshold.  Trim any bushes or trees on the path to your home.  Use bright outdoor lighting.  Clear any walking paths of anything that might make someone trip, such as rocks or tools.  Regularly check to see if handrails are loose or broken. Make sure that both  sides of any steps have handrails.  Any raised decks and porches should have guardrails on the edges.  Have any leaves, snow, or ice cleared regularly.  Use sand or salt on walking paths during winter.  Clean up any spills in your garage right away. This includes oil or grease spills. What can I do in the bathroom?  Use night lights.  Install grab bars by the toilet and in the tub and shower. Do not use towel bars as grab bars.  Use non-skid mats or decals in the tub or shower.  If you need to sit down in the shower, use a plastic, non-slip stool.  Keep the floor dry. Clean up any water that spills on the floor as soon as it happens.  Remove soap buildup in the tub or shower regularly.  Attach bath mats securely with double-sided non-slip rug tape.  Do not have throw rugs and other things on the floor that can make you trip. What can I do in the bedroom?  Use night lights.  Make sure that you have a light by your bed that is easy to reach.  Do not use any sheets or blankets that are too big for your bed. They  should not hang down onto the floor.  Have a firm chair that has side arms. You can use this for support while you get dressed.  Do not have throw rugs and other things on the floor that can make you trip. What can I do in the kitchen?  Clean up any spills right away.  Avoid walking on wet floors.  Keep items that you use a lot in easy-to-reach places.  If you need to reach something above you, use a strong step stool that has a grab bar.  Keep electrical cords out of the way.  Do not use floor polish or wax that makes floors slippery. If you must use wax, use non-skid floor wax.  Do not have throw rugs and other things on the floor that can make you trip. What can I do with my stairs?  Do not leave any items on the stairs.  Make sure that there are handrails on both sides of the stairs and use them. Fix handrails that are broken or loose. Make sure that  handrails are as long as the stairways.  Check any carpeting to make sure that it is firmly attached to the stairs. Fix any carpet that is loose or worn.  Avoid having throw rugs at the top or bottom of the stairs. If you do have throw rugs, attach them to the floor with carpet tape.  Make sure that you have a light switch at the top of the stairs and the bottom of the stairs. If you do not have them, ask someone to add them for you. What else can I do to help prevent falls?  Wear shoes that:  Do not have high heels.  Have rubber bottoms.  Are comfortable and fit you well.  Are closed at the toe. Do not wear sandals.  If you use a stepladder:  Make sure that it is fully opened. Do not climb a closed stepladder.  Make sure that both sides of the stepladder are locked into place.  Ask someone to hold it for you, if possible.  Clearly mark and make sure that you can see:  Any grab bars or handrails.  First and last steps.  Where the edge of each step is.  Use tools that help you move around (mobility aids) if they are needed. These include:  Canes.  Walkers.  Scooters.  Crutches.  Turn on the lights when you go into a dark area. Replace any light bulbs as soon as they burn out.  Set up your furniture so you have a clear path. Avoid moving your furniture around.  If any of your floors are uneven, fix them.  If there are any pets around you, be aware of where they are.  Review your medicines with your doctor. Some medicines can make you feel dizzy. This can increase your chance of falling. Ask your doctor what other things that you can do to help prevent falls. This information is not intended to replace advice given to you by your health care provider. Make sure you discuss any questions you have with your health care provider. Document Released: 12/23/2008 Document Revised: 08/04/2015 Document Reviewed: 04/02/2014 Elsevier Interactive Patient Education  2017  [employer].

## 2016-12-12 LAB — HEPATITIS C ANTIBODY: Hep C Virus Ab: <0.1 s/co ratio (ref 0.0–0.9)

## 2016-12-12 LAB — HIV ANTIBODY (ROUTINE TESTING W REFLEX): HIV Screen 4th Generation wRfx: NONREACTIVE

## 2017-02-27 ENCOUNTER — Encounter: Payer: Self-pay | Admitting: Physician Assistant

## 2017-02-27 ENCOUNTER — Ambulatory Visit (INDEPENDENT_AMBULATORY_CARE_PROVIDER_SITE_OTHER): Payer: Medicare Other | Admitting: Physician Assistant

## 2017-02-27 VITALS — BP 126/80 | HR 70 | Temp 98.7°F | Resp 17 | Ht 66.0 in | Wt 147.0 lb

## 2017-02-27 DIAGNOSIS — R059 Cough, unspecified: Secondary | ICD-10-CM

## 2017-02-27 DIAGNOSIS — R05 Cough: Secondary | ICD-10-CM

## 2017-02-27 DIAGNOSIS — J040 Acute laryngitis: Secondary | ICD-10-CM

## 2017-02-27 MED ORDER — BENZONATATE 100 MG PO CAPS: 100.0000 mg | ORAL_CAPSULE | Freq: Three times a day (TID) | ORAL | 0 refills | Status: DC | PRN

## 2017-02-27 MED ORDER — PREDNISONE 20 MG PO TABS: 40.0000 mg | ORAL_TABLET | Freq: Every day | ORAL | 0 refills | Status: AC

## 2017-02-27 NOTE — Progress Notes (Signed)
   Patty Martinez  MRN: [id number] DOB: 12-Jul-1960  PCP: Magdalene RiverWiseman, Brittany D, PA-C  Subjective:  Pt is a 56 year old female who presents to clinic for hoarseness x 3 days. Mild sore throat. Coughing during the day. Denies fever, chills, sinus pain, ear pain, sneezing, nasal drainage, pain with swallowing. She has take NyQuil - helping her sleep.  No known sick contacts.  She just received news today that her mother is not doing well and [place] to take care of her.     Review of Systems  Constitutional: Negative for chills, diaphoresis, fatigue and fever.  HENT: Positive for sore throat and voice change. Negative for congestion, ear pain, postnasal drip, rhinorrhea, sinus pressure and trouble swallowing.   Respiratory: Positive for cough. Negative for chest tightness, shortness of breath and wheezing.   Cardiovascular: Negative for chest pain and palpitations.  Psychiatric/Behavioral: Negative for sleep disturbance.    Patient Active Problem List   Diagnosis Date Noted  . Degenerative disc disease, lumbar 09/14/2016  . Hyperlipidemia 05/13/2014    Current Outpatient Medications on File Prior to Visit  Medication Sig Dispense Refill  . pravastatin (PRAVACHOL) 40 MG tablet TAKE 1 TABLET(40 MG) BY MOUTH DAILY 90 tablet 1   No current facility-administered medications on file prior to visit.     No Known Allergies   Objective:  BP 126/80   Pulse 70   Temp 98.7 F (37.1 C) (Oral)   Resp 17   Ht 5\' 6"  (1.676 m)   Wt 147 lb (66.7 kg)   LMP 11/14/2012   SpO2 98%   BMI 23.73 kg/m   Physical Exam  Constitutional: She is oriented to person, place, and time and well-developed, well-nourished, and in no distress. No distress.  HENT:  Right Ear: Tympanic membrane normal.  Left Ear: Tympanic membrane normal.  Nose: Mucosal edema present. No rhinorrhea. Right sinus exhibits no maxillary sinus tenderness and no frontal sinus tenderness. Left sinus exhibits  no maxillary sinus tenderness and no frontal sinus tenderness.  Mouth/Throat: Oropharynx is clear and moist and mucous membranes are normal.  Cardiovascular: Normal rate, regular rhythm and normal heart sounds.  Pulmonary/Chest: Effort normal and breath sounds normal. No respiratory distress. She has no wheezes. She has no rales.  Neurological: She is alert and oriented to person, place, and time. GCS score is 15.  Skin: Skin is warm and dry.  Psychiatric: Mood, memory, affect and judgment normal.  Vitals reviewed.   Assessment and Plan :  1. Laryngitis - predniSONE (DELTASONE) 20 MG tablet; Take 2 tablets (40 mg total) by mouth daily with breakfast for 5 days.  Dispense: 10 tablet; Refill: 0 - Pt leaving for [place] in a few days, plan to treat with Prednisone. Stay well hydrated, salt water gargles. RTC PRN 2. Cough - benzonatate (TESSALON) 100 MG capsule; Take 1-2 capsules (100-200 mg total) by mouth 3 (three) times daily as needed for cough.  Dispense: 40 capsule; Refill: 0   Marco CollieWhitney Haiven Nardone, PA-C  Primary Care at [facility] 02/27/2017 5:00 PM

## 2017-02-27 NOTE — Patient Instructions (Addendum)
Take 40mg  Prednisone for 5 days.  Tessalon (benzonatate) is for cough. Take this as needed.   Stay well hydrated. Get lost of rest.  Advil or ibuprofen for pain. Do not take Aspirin.  Cepacol throat lozenges (if you are not at risk for choking) or sprays may be used to soothe your throat. Drink enough water and fluids to keep your urine clear or pale yellow. For sore throat: ? Gargle with 8 oz of salt water ( tsp of salt per 1 qt of water) as often as every 1-2 hours to soothe your throat.  Gargle liquid benadryl.  Use Elderberry syrup.   For sore throat try using a honey-based tea. Use 3 teaspoons of honey with juice squeezed from half lemon. Place shaved pieces of ginger into 1/2-1 cup of water and warm over stove top. Then mix the ingredients and repeat every 4 hours as needed.  Cough Syrup Recipe: Sweet Lemon & Honey Thyme  Ingredients a handful of fresh thyme sprigs   1 pint of water (2 cups)  1/2 cup honey (raw is best, but regular will do)  1/2 lemon chopped Instructions 1. Place the lemon in the pint jar and cover with the honey. The honey will macerate the lemons and draw out liquids which taste so delicious! 2. Meanwhile, toss the thyme leaves into a saucepan and cover them with the water. 3. Bring the water to a gentle simmer and reduce it to half, about a cup of tea. 4. When the tea is reduced and cooled a bit, strain the sprigs & leaves, add it into the pint jar and stir it well. 5. Give it a shake and use a spoonful as needed. 6. Store your homemade cough syrup in the refrigerator for about a month.  What causes a cough? In adults, common causes of a cough include: ?An infection of the airways or lungs (such as the common cold) ?Postnasal drip - Postnasal drip is when mucus from the nose drips down or flows along the back of the throat. Postnasal drip can happen when people have: .A cold .Allergies .A sinus infection - The sinuses are hollow areas in the bones of the  face that open into the nose. ?Lung conditions, like asthma and chronic obstructive pulmonary disease (COPD) - Both of these conditions can make it hard to breathe. COPD is usually caused by smoking. ?Acid reflux - Acid reflux is when the acid that is normally in your stomach backs up into your esophagus (the tube that carries food from your mouth to your stomach). ?A side effect from blood pressure medicines called "ACE inhibitors" ?Smoking cigarettes  Is there anything I can do on my own to get rid of my cough? Yes. To help get rid of your cough, you can: ?Use a humidifier in your bedroom ?Use an over-the-counter cough medicine, or suck on cough drops or hard candy ?Stop smoking, if you smoke ?If you have allergies, avoid the things you are allergic to (like pollen, dust, animals, or mold) If you have acid reflux, your doctor or nurse will tell you which lifestyle changes can help reduce symptoms.    IF you received an x-ray today, you will receive an invoice from [facility]. Please contact [facility] at [phone] with questions or concerns regarding your invoice.   IF you received labwork today, you will receive an invoice from [facility]. Please contact [facility] at [phone] with questions or concerns regarding your invoice.   Our billing staff will not  be able to assist you with questions regarding bills from these companies.  You will be contacted with the lab results as soon as they are available. The fastest way to get your results is to activate your My Chart account. Instructions are located on the last page of this paperwork. If you have not heard from [place] regarding the results in 2 weeks, please contact this office.

## 2017-03-02 ENCOUNTER — Encounter: Payer: Self-pay | Admitting: Physician Assistant

## 2017-05-16 ENCOUNTER — Other Ambulatory Visit: Payer: Self-pay | Admitting: Physician Assistant

## 2017-05-16 DIAGNOSIS — E785 Hyperlipidemia, unspecified: Secondary | ICD-10-CM

## 2017-05-21 ENCOUNTER — Other Ambulatory Visit: Payer: Self-pay

## 2017-05-21 ENCOUNTER — Ambulatory Visit (INDEPENDENT_AMBULATORY_CARE_PROVIDER_SITE_OTHER): Payer: Medicare Other | Admitting: Physician Assistant

## 2017-05-21 ENCOUNTER — Encounter: Payer: Self-pay | Admitting: Physician Assistant

## 2017-05-21 VITALS — BP 131/82 | HR 66 | Temp 99.0°F | Resp 16 | Ht 66.0 in | Wt 149.4 lb

## 2017-05-21 DIAGNOSIS — E785 Hyperlipidemia, unspecified: Secondary | ICD-10-CM | POA: Diagnosis not present

## 2017-05-21 DIAGNOSIS — R739 Hyperglycemia, unspecified: Secondary | ICD-10-CM

## 2017-05-21 MED ORDER — PRAVASTATIN SODIUM 40 MG PO TABS: 40.0000 mg | ORAL_TABLET | Freq: Every day | ORAL | 1 refills | Status: DC

## 2017-05-21 NOTE — Patient Instructions (Addendum)
We have collected labs and should have those results within one week. Please follow up in 7 months for medicare visit with Calandra and then see me within a week after that. Thank you for letting me participate in your health and well being. Food Choices to Lower Your Triglycerides Triglycerides are a type of fat in your blood. High levels of triglycerides can increase the risk of heart disease and stroke. If your triglyceride levels are high, the foods you eat and your eating habits are very important. Choosing the right foods can help lower your triglycerides. What general guidelines do I need to follow?  Lose weight if you are overweight.  Limit or avoid alcohol.  Fill one half of your plate with vegetables and green salads.  Limit fruit to two servings a day. Choose fruit instead of juice.  Make one fourth of your plate whole grains. Look for the word "whole" as the first word in the ingredient list.  Fill one fourth of your plate with lean protein foods.  Enjoy fatty fish (such as salmon, mackerel, sardines, and tuna) three times a week.  Choose healthy fats.  Limit foods high in starch and sugar.  Eat more home-cooked food and less restaurant, buffet, and fast food.  Limit fried foods.  Cook foods using methods other than frying.  Limit saturated fats.  Check ingredient lists to avoid foods with partially hydrogenated oils (trans fats) in them. What foods can I eat? Grains Whole grains, such as whole wheat or whole grain breads, crackers, cereals, and pasta. Unsweetened oatmeal, bulgur, barley, quinoa, or brown rice. Corn or whole wheat flour tortillas. Vegetables Fresh or frozen vegetables (raw, steamed, roasted, or grilled). Green salads. Fruits All fresh, canned (in natural juice), or frozen fruits. Meat and Other Protein Products Ground beef (85% or leaner), grass-fed beef, or beef trimmed of fat. Skinless chicken or [place]. Ground chicken or [place]. Pork trimmed of  fat. All fish and seafood. Eggs. Dried beans, peas, or lentils. Unsalted nuts or seeds. Unsalted canned or dry beans. Dairy Low-fat dairy products, such as skim or 1% milk, 2% or reduced-fat cheeses, low-fat ricotta or cottage cheese, or plain low-fat yogurt. Fats and Oils Tub margarines without trans fats. Light or reduced-fat mayonnaise and salad dressings. Avocado. Safflower, olive, or canola oils. Natural peanut or almond butter. The items listed above may not be a complete list of recommended foods or beverages. Contact your dietitian for more options. What foods are not recommended? Grains White bread. White pasta. White rice. Cornbread. Bagels, pastries, and croissants. Crackers that contain trans fat. Vegetables White potatoes. Corn. Creamed or fried vegetables. Vegetables in a cheese sauce. Fruits Dried fruits. Canned fruit in light or heavy syrup. Fruit juice. Meat and Other Protein Products Fatty cuts of meat. Ribs, chicken wings, bacon, sausage, bologna, salami, chitterlings, fatback, hot dogs, bratwurst, and packaged luncheon meats. Dairy Whole or 2% milk, cream, half-and-half, and cream cheese. Whole-fat or sweetened yogurt. Full-fat cheeses. Nondairy creamers and whipped toppings. Processed cheese, cheese spreads, or cheese curds. Sweets and Desserts Corn syrup, sugars, honey, and molasses. Candy. Jam and jelly. Syrup. Sweetened cereals. Cookies, pies, cakes, donuts, muffins, and ice cream. Fats and Oils Butter, stick margarine, lard, shortening, ghee, or bacon fat. Coconut, palm kernel, or palm oils. Beverages Alcohol. Sweetened drinks (such as sodas, lemonade, and fruit drinks or punches). The items listed above may not be a complete list of foods and beverages to avoid. Contact your dietitian for more information. This information  is not intended to replace advice given to you by your health care provider. Make sure you discuss any questions you have with your health care  provider. Document Released: 12/15/2003 Document Revised: 08/04/2015 Document Reviewed: 12/31/2012 Elsevier Interactive Patient Education  2017 [employer]   IF you received an x-ray today, you will receive an invoice from [facility]. Please contact [facility] at [phone] with questions or concerns regarding your invoice.   IF you received labwork today, you will receive an invoice from [facility]. Please contact [facility] at [phone] with questions or concerns regarding your invoice.   Our billing staff will not be able to assist you with questions regarding bills from these companies.  You will be contacted with the lab results as soon as they are available. The fastest way to get your results is to activate your My Chart account. Instructions are located on the last page of this paperwork. If you have not heard from us regarding the results in 2 weeks, please contact this office.

## 2017-05-21 NOTE — Progress Notes (Signed)
   Patty Martinez  MRN: [id number] DOB: 12-13-1960  Subjective:  Patty Martinez is a 57 y.o. female seen in office today for a chief complaint of follow-up on hyperlipidemia.  Patient is controlled on pravastatin 40 mg daily.   Has a well-balanced diet.  Likes grilled chicken, fish, vegetables, and fruits.  Eats minimal red food.  Drinks water and coffee. She is not currently exercising. Denies abdominal pain, nausea, vomiting, headache, diarrhea, and muscle aches. Patient is also requesting to have her A1c checked.  Notes that she had elevated glucose in the past and wants to be screened for diabetes.  Denies polyuria, polydipsia, alopecia, not dry mouth, and blurred vision.    Review of Systems  Constitutional: Negative for chills, diaphoresis and fever.  Neurological: Negative for dizziness, light-headedness and headaches.    Patient Active Problem List   Diagnosis Date Noted  . Degenerative disc disease, lumbar 09/14/2016  . Hyperlipidemia 05/13/2014    Current Outpatient Medications on File Prior to Visit  Medication Sig Dispense Refill  . pravastatin (PRAVACHOL) 40 MG tablet TAKE 1 TABLET(40 MG) BY MOUTH DAILY 90 tablet 0   No current facility-administered medications on file prior to visit.     No Known Allergies   Objective:  BP 131/82 (BP Location: Right Arm, Patient Position: Sitting, Cuff Size: Normal)   Pulse 66   Temp 99 F (37.2 C) (Oral)   Resp 16   Ht _0  (1.676 m)   Wt 149 lb 6.4 oz (67.8 kg)   LMP 11/14/2012   SpO2 100%   BMI 24.11 kg/m   Physical Exam  Constitutional: She is oriented to person, place, and time and well-developed, well-nourished, and in no distress.  HENT:  Head: Normocephalic and atraumatic.  Eyes: Conjunctivae are normal.  Neck: Normal range of motion.  Cardiovascular: Normal rate, regular rhythm, normal heart sounds and intact distal pulses.  Pulmonary/Chest: Effort normal.  Neurological: She is alert and oriented to person, place,  and time. Gait normal.  Skin: Skin is warm and dry.  Psychiatric: Affect normal.  Vitals reviewed.   Assessment and Plan :  1. Hyperlipidemia, unspecified hyperlipidemia type Labs pending.  Follow-up in 7 months for Medicare subsequent annual preventive examination. - CMP14+EGFR - Lipid panel - pravastatin (PRAVACHOL) 40 MG tablet; Take 1 tablet (40 mg total) by mouth daily.  Dispense: 90 tablet; Refill: 1  2. Hyperglycemia Labs pending. - Hemoglobin A1c  Tenna Delaine, PA-C  Primary Care at [facility] 05/21/2017 8:42 AM    Tenna Delaine PA-C  Primary Care at [facility] 05/21/2017 8:10 AM

## 2017-05-22 LAB — CMP14+EGFR
A/G RATIO: 1.9 (ref 1.2–2.2)
ALT: 18 IU/L (ref 0–32)
AST: 25 IU/L (ref 0–40)
Albumin: 4.9 g/dL (ref 3.5–5.5)
Alkaline Phosphatase: 59 IU/L (ref 39–117)
BUN/Creatinine Ratio: 15 (ref 9–23)
BUN: 12 mg/dL (ref 6–24)
Bilirubin Total: 0.5 mg/dL (ref 0.0–1.2)
CHLORIDE: 105 mmol/L (ref 96–106)
CO2: 22 mmol/L (ref 20–29)
Calcium: 9.7 mg/dL (ref 8.7–10.2)
Creatinine, Ser: 0.8 mg/dL (ref 0.57–1.00)
GFR calc non Af Amer: 83 mL/min/{1.73_m2} (ref >59)
GFR, EST AFRICAN AMERICAN: 95 mL/min/{1.73_m2} (ref >59)
GLOBULIN, TOTAL: 2.6 g/dL (ref 1.5–4.5)
Glucose: 98 mg/dL (ref 65–99)
POTASSIUM: 4.6 mmol/L (ref 3.5–5.2)
SODIUM: 145 mmol/L — AB (ref 134–144)
Total Protein: 7.5 g/dL (ref 6.0–8.5)

## 2017-05-22 LAB — LIPID PANEL
CHOL/HDL RATIO: 2.5 ratio (ref 0.0–4.4)
Cholesterol, Total: 188 mg/dL (ref 100–199)
HDL: 75 mg/dL (ref >39)
LDL Calculated: 96 mg/dL (ref 0–99)
TRIGLYCERIDES: 83 mg/dL (ref 0–149)
VLDL Cholesterol Cal: 17 mg/dL (ref 5–40)

## 2017-05-22 LAB — HEMOGLOBIN A1C
Est. average glucose Bld gHb Est-mCnc: 111 mg/dL
HEMOGLOBIN A1C: 5.5 % (ref 4.8–5.6)

## 2017-05-28 ENCOUNTER — Encounter: Payer: Self-pay | Admitting: *Deleted

## 2017-11-18 DIAGNOSIS — Z01419 Encounter for gynecological examination (general) (routine) without abnormal findings: Secondary | ICD-10-CM | POA: Diagnosis not present

## 2017-11-18 DIAGNOSIS — Z1231 Encounter for screening mammogram for malignant neoplasm of breast: Secondary | ICD-10-CM | POA: Diagnosis not present

## 2017-11-18 DIAGNOSIS — Z124 Encounter for screening for malignant neoplasm of cervix: Secondary | ICD-10-CM | POA: Diagnosis not present

## 2017-11-19 ENCOUNTER — Ambulatory Visit (INDEPENDENT_AMBULATORY_CARE_PROVIDER_SITE_OTHER): Payer: Medicare Other | Admitting: Physician Assistant

## 2017-11-19 ENCOUNTER — Encounter: Payer: Self-pay | Admitting: Physician Assistant

## 2017-11-19 ENCOUNTER — Other Ambulatory Visit: Payer: Self-pay

## 2017-11-19 VITALS — BP 122/70 | HR 70 | Temp 98.8°F | Resp 18 | Ht 67.13 in | Wt 144.8 lb

## 2017-11-19 DIAGNOSIS — R0981 Nasal congestion: Secondary | ICD-10-CM

## 2017-11-19 DIAGNOSIS — Z131 Encounter for screening for diabetes mellitus: Secondary | ICD-10-CM

## 2017-11-19 DIAGNOSIS — E785 Hyperlipidemia, unspecified: Secondary | ICD-10-CM

## 2017-11-19 DIAGNOSIS — Z23 Encounter for immunization: Secondary | ICD-10-CM | POA: Diagnosis not present

## 2017-11-19 DIAGNOSIS — Z1389 Encounter for screening for other disorder: Secondary | ICD-10-CM

## 2017-11-19 DIAGNOSIS — Z130 Encounter for screening for diseases of the blood and blood-forming organs and certain disorders involving the immune mechanism: Secondary | ICD-10-CM | POA: Diagnosis not present

## 2017-11-19 DIAGNOSIS — Z0001 Encounter for general adult medical examination with abnormal findings: Secondary | ICD-10-CM

## 2017-11-19 DIAGNOSIS — Z0000 Encounter for general adult medical examination without abnormal findings: Secondary | ICD-10-CM

## 2017-11-19 LAB — CBC WITH DIFFERENTIAL/PLATELET
BASOS: 0 %
Basophils Absolute: 0 10*3/uL (ref 0.0–0.2)
EOS (ABSOLUTE): 0.1 10*3/uL (ref 0.0–0.4)
EOS: 1 %
HEMATOCRIT: 40.5 % (ref 34.0–46.6)
Hemoglobin: 13.5 g/dL (ref 11.1–15.9)
IMMATURE GRANULOCYTES: 0 %
Immature Grans (Abs): 0 10*3/uL (ref 0.0–0.1)
LYMPHS ABS: 1.9 10*3/uL (ref 0.7–3.1)
Lymphs: 42 %
MCH: 30.7 pg (ref 26.6–33.0)
MCHC: 33.3 g/dL (ref 31.5–35.7)
MCV: 92 fL (ref 79–97)
MONOS ABS: 0.5 10*3/uL (ref 0.1–0.9)
Monocytes: 12 %
NEUTROS ABS: 2 10*3/uL (ref 1.4–7.0)
Neutrophils: 45 %
Platelets: 252 10*3/uL (ref 150–450)
RBC: 4.4 x10E6/uL (ref 3.77–5.28)
RDW: 13.1 % (ref 12.3–15.4)
WBC: 4.5 10*3/uL (ref 3.4–10.8)

## 2017-11-19 LAB — CMP14+EGFR
ALBUMIN: 4.9 g/dL (ref 3.5–5.5)
ALT: 18 IU/L (ref 0–32)
AST: 23 IU/L (ref 0–40)
Albumin/Globulin Ratio: 2 (ref 1.2–2.2)
Alkaline Phosphatase: 63 IU/L (ref 39–117)
BUN / CREAT RATIO: 14 (ref 9–23)
BUN: 11 mg/dL (ref 6–24)
Bilirubin Total: 0.6 mg/dL (ref 0.0–1.2)
CALCIUM: 9.8 mg/dL (ref 8.7–10.2)
CO2: 23 mmol/L (ref 20–29)
Chloride: 103 mmol/L (ref 96–106)
Creatinine, Ser: 0.77 mg/dL (ref 0.57–1.00)
GFR calc non Af Amer: 86 mL/min/{1.73_m2} (ref >59)
GFR, EST AFRICAN AMERICAN: 99 mL/min/{1.73_m2} (ref >59)
GLOBULIN, TOTAL: 2.5 g/dL (ref 1.5–4.5)
Glucose: 95 mg/dL (ref 65–99)
Potassium: 4.8 mmol/L (ref 3.5–5.2)
SODIUM: 143 mmol/L (ref 134–144)
TOTAL PROTEIN: 7.4 g/dL (ref 6.0–8.5)

## 2017-11-19 LAB — LIPID PANEL
CHOLESTEROL TOTAL: 199 mg/dL (ref 100–199)
Chol/HDL Ratio: 3.2 ratio (ref 0.0–4.4)
HDL: 63 mg/dL (ref >39)
LDL CALC: 119 mg/dL — AB (ref 0–99)
TRIGLYCERIDES: 87 mg/dL (ref 0–149)
VLDL Cholesterol Cal: 17 mg/dL (ref 5–40)

## 2017-11-19 LAB — URINALYSIS, DIPSTICK ONLY
Bilirubin, UA: NEGATIVE
GLUCOSE, UA: NEGATIVE
Ketones, UA: NEGATIVE
LEUKOCYTES UA: NEGATIVE
Nitrite, UA: NEGATIVE
Protein, UA: NEGATIVE
Specific Gravity, UA: 1.023 (ref 1.005–1.030)
Urobilinogen, Ur: 0.2 mg/dL (ref 0.2–1.0)
pH, UA: 6 (ref 5.0–7.5)

## 2017-11-19 MED ORDER — PRAVASTATIN SODIUM 40 MG PO TABS: 40.0000 mg | ORAL_TABLET | Freq: Every day | ORAL | 3 refills | Status: DC

## 2017-11-19 MED ORDER — FLUTICASONE PROPIONATE 50 MCG/ACT NA SUSP: 2.0000 | Freq: Every day | NASAL | 6 refills | Status: AC

## 2017-11-19 NOTE — Progress Notes (Signed)
Presents today for [employer] Visit-Subsequent.   Date of last exam: 12/11/2016  Interpreter used for this visit? no  Patient Care Team: Donzetta Kohut as PCP - General (Physician Assistant) Azucena Fallen, MD as Consulting Physician (Obstetrics and Gynecology)   Other items to address today: Needs medication refills for HLD.Patient is controlled on pravastatin 40 mg daily.   Has a well-balanced diet.  Likes grilled chicken, fish, vegetables, and fruits.  Eats minimal red food.  Drinks water and coffee. Exercises for about 15-30 mintues a couple days per week. Denies abdominal pain, nausea, vomiting, headache, diarrhea, and muscle aches.   Cancer Screening: Cervical: yes Breast: yes Colon: yes  Prostate: n/a   Other Screening: Last screening for diabetes: 05/2017 Last lipid screening: Pt has HLD  ADVANCE DIRECTIVES: Discussed: yes Patient will take information home and determine if she desires CPR, mechanical ventilation, prolonged artificial support (may include mechanical ventilation, tube/PEG feeding, etc)  On File: no Materials Provided: yes  Immunization status:  Immunization History  Administered Date(s) Administered  . Influenza,inj,Quad PF,6+ Mos 12/03/2013, 11/02/2014, 11/08/2015, 11/13/2016  . Tdap 11/17/2013     Health Maintenance Due  Topic Date Due  . INFLUENZA VACCINE  10/10/2017     Functional Status Survey:  6CIT Screen 12/11/2016  What Year? 0 points  What month? 0 points  What time? 0 points  Count back from 20 0 points  Months in reverse 0 points  Repeat phrase 8 points  Total Score 8       Home Environment: Lives at home with husband.  Urinary Incontinence Screening: Denies stress/urge incontinence  Patient Active Problem List   Diagnosis Date Noted  . Degenerative disc disease, lumbar 09/14/2016  . Hyperlipidemia 05/13/2014     Past Medical History:  Diagnosis Date  . Arthritis      Past  Surgical History:  Procedure Laterality Date  . KNEE SURGERY    . shoulder surgery  2010     Family History  Problem Relation Age of Onset  . Cancer Father        throat  . Cancer - Lung Sister   . Colon cancer Neg Hx      Social History   Socioeconomic History  . Marital status: Married    Spouse name: Not on file  . Number of children: 2  . Years of education: Not on file  . Highest education level: Not on file  Occupational History  . Not on file  Social Needs  . Financial resource strain: Not on file  . Food insecurity:    Worry: Not on file    Inability: Not on file  . Transportation needs:    Medical: Not on file    Non-medical: Not on file  Tobacco Use  . Smoking status: Never Smoker  . Smokeless tobacco: Never Used  Substance and Sexual Activity  . Alcohol use: No    Alcohol/week: 0.0 standard drinks  . Drug use: No  . Sexual activity: Yes    Partners: Male    Comment: monogamous with husbnad  Lifestyle  . Physical activity:    Days per week: Not on file    Minutes per session: Not on file  . Stress: Not on file  Relationships  . Social connections:    Talks on phone: Three times a week    Gets together: Twice a week    Attends religious service: More than 4 times per year  Active member of club or organization: Yes    Attends meetings of clubs or organizations: More than 4 times per year    Relationship status: Married  . Intimate partner violence:    Fear of current or ex partner: No    Emotionally abused: No    Physically abused: No    Forced sexual activity: No  Other Topics Concern  . Not on file  Social History Narrative   On disability. Lives at home with husband. Has two children who live close by.      No Known Allergies   Prior to Admission medications   Medication Sig Start Date End Date Taking? Authorizing Provider  pravastatin (PRAVACHOL) 40 MG tablet Take 1 tablet (40 mg total) by mouth daily. 05/21/17   Tenna Delaine D, PA-C     Depression screen [facility] 2/9 11/19/2017 05/21/2017 02/27/2017 12/11/2016 11/13/2016  Decreased Interest 0 0 0 3 0  Down, Depressed, Hopeless 0 0 0 3 0  PHQ - 2 Score 0 0 0 6 0  Altered sleeping - - - 3 -  Tired, decreased energy - - - 1 -  Change in appetite - - - 0 -  Feeling bad or failure about yourself  - - - 1 -  Trouble concentrating - - - 1 -  Moving slowly or fidgety/restless - - - 0 -  Suicidal thoughts - - - 0 -  PHQ-9 Score - - - 12 -  Difficult doing work/chores - - - Somewhat difficult -     Fall Risk  11/19/2017 05/21/2017 02/27/2017 12/11/2016 11/13/2016  Falls in the past year? Yes No No No No  Number falls in past yr: 1 - - - -  Comment left leg 10/2017 - - - -  Injury with Fall? Yes - - - -      PHYSICAL EXAM: BP 122/70   Pulse 70   Temp 98.8 F (37.1 C) (Oral)   Resp 18   Ht 5' 7.13" (1.705 m)   Wt 144 lb 12.8 oz (65.7 kg)   LMP 11/14/2012   SpO2 98%   BMI 22.59 kg/m    Wt Readings from Last 3 Encounters:  11/19/17 144 lb 12.8 oz (65.7 kg)  05/21/17 149 lb 6.4 oz (67.8 kg)  02/27/17 147 lb (66.7 kg)     No exam data present   Physical Exam  Constitutional: She is oriented to person, place, and time and well-developed, well-nourished, and in no distress. No distress.  HENT:  Head: Normocephalic and atraumatic.  Right Ear: Hearing, tympanic membrane, external ear and ear canal normal.  Left Ear: Hearing, tympanic membrane, external ear and ear canal normal.  Nose: Mucosal edema (mild right, moderate left) present.  Mouth/Throat: Uvula is midline, oropharynx is clear and moist and mucous membranes are normal. No oropharyngeal exudate.  Eyes: Pupils are equal, round, and reactive to light. Conjunctivae, EOM and lids are normal. No scleral icterus.  Neck: Trachea normal and normal range of motion. No thyroid mass and no thyromegaly present.  Cardiovascular: Normal rate, regular rhythm, normal heart sounds and intact distal pulses.    Pulmonary/Chest: Effort normal and breath sounds normal.  Abdominal: Soft. Normal appearance and bowel sounds are normal. There is no tenderness.  Musculoskeletal: Normal range of motion.  Lymphadenopathy:       Head (right side): No tonsillar, no preauricular, no posterior auricular and no occipital adenopathy present.       Head (left side): No tonsillar,  no preauricular, no posterior auricular and no occipital adenopathy present.    She has no cervical adenopathy.       Right: No supraclavicular adenopathy present.       Left: No supraclavicular adenopathy present.  Neurological: She is alert and oriented to person, place, and time. She has normal sensation, normal strength and normal reflexes. Gait normal.  Skin: Skin is warm and dry.  Psychiatric: Affect normal.     Education/Counseling provided regarding diet and exercise, prevention of chronic diseases, smoking/tobacco cessation, if applicable, and reviewed "Covered Medicare Preventive Services."   ASSESSMENT/PLAN: 1. Medicare annual wellness visit, subsequent 2. Hyperlipidemia, unspecified hyperlipidemia type Labs pending. Continue with current medication regimen at this time.  - Lipid panel - CBC with Differential/Platelet - pravastatin (PRAVACHOL) 40 MG tablet; Take 1 tablet (40 mg total) by mouth daily.  Dispense: 90 tablet; Refill: 3  3. Nasal congestion - fluticasone (FLONASE) 50 MCG/ACT nasal spray; Place 2 sprays into both nostrils daily.  Dispense: 16 g; Refill: 6  4. Screening, anemia, deficiency, iron - CBC with Differential/Platelet  5. Screening for diabetes mellitus - CMP14+EGFR  6. Screening for deficiency anemia - CBC with Differential/Platelet  7. Screening for hematuria or proteinuria - Urinalysis, dipstick only  8. Need for influenza vaccination - Flu Vaccine QUAD 36+ mos IM

## 2017-11-19 NOTE — Patient Instructions (Addendum)
Follow up in one year.  If you have lab work done today you will be contacted with your lab results within the next 2 weeks.  If you have not heard from [place] then please contact us. The fastest way to get your results is to register for My Chart.  Health Maintenance for Postmenopausal Women Menopause is a normal process in which your reproductive ability comes to an end. This process happens gradually over a span of months to years, usually between the ages of 24 and 33. Menopause is complete when you have missed 12 consecutive menstrual periods. It is important to talk with your health care provider about some of the most common conditions that affect postmenopausal women, such as heart disease, cancer, and bone loss (osteoporosis). Adopting a healthy lifestyle and getting preventive care can help to promote your health and wellness. Those actions can also lower your chances of developing some of these common conditions. What should I know about menopause? During menopause, you may experience a number of symptoms, such as:  Moderate-to-severe hot flashes.  Night sweats.  Decrease in sex drive.  Mood swings.  Headaches.  Tiredness.  Irritability.  Memory problems.  Insomnia.  Choosing to treat or not to treat menopausal changes is an individual decision that you make with your health care provider. What should I know about hormone replacement therapy and supplements? Hormone therapy products are effective for treating symptoms that are associated with menopause, such as hot flashes and night sweats. Hormone replacement carries certain risks, especially as you become older. If you are thinking about using estrogen or estrogen with progestin treatments, discuss the benefits and risks with your health care provider. What should I know about heart disease and stroke? Heart disease, heart attack, and stroke become more likely as you age. This may be due, in part, to the hormonal changes that  your body experiences during menopause. These can affect how your body processes dietary fats, triglycerides, and cholesterol. Heart attack and stroke are both medical emergencies. There are many things that you can do to help prevent heart disease and stroke:  Have your blood pressure checked at least every 1-2 years. High blood pressure causes heart disease and increases the risk of stroke.  If you are 40-34 years old, ask your health care provider if you should take aspirin to prevent a heart attack or a stroke.  Do not use any tobacco products, including cigarettes, chewing tobacco, or electronic cigarettes. If you need help quitting, ask your health care provider.  It is important to eat a healthy diet and maintain a healthy weight. ? Be sure to include plenty of vegetables, fruits, low-fat dairy products, and lean protein. ? Avoid eating foods that are high in solid fats, added sugars, or salt (sodium).  Get regular exercise. This is one of the most important things that you can do for your health. ? Try to exercise for at least 150 minutes each week. The type of exercise that you do should increase your heart rate and make you sweat. This is known as moderate-intensity exercise. ? Try to do strengthening exercises at least twice each week. Do these in addition to the moderate-intensity exercise.  Know your numbers.Ask your health care provider to check your cholesterol and your blood glucose. Continue to have your blood tested as directed by your health care provider.  What should I know about cancer screening? There are several types of cancer. Take the following steps to reduce your risk and  to catch any cancer development as early as possible. Breast Cancer  Practice breast self-awareness. ? This means understanding how your breasts normally appear and feel. ? It also means doing regular breast self-exams. Let your health care provider know about any changes, no matter how  small.  If you are 65 or older, have a clinician do a breast exam (clinical breast exam or CBE) every year. Depending on your age, family history, and medical history, it may be recommended that you also have a yearly breast X-ray (mammogram).  If you have a family history of breast cancer, talk with your health care provider about genetic screening.  If you are at high risk for breast cancer, talk with your health care provider about having an MRI and a mammogram every year.  Breast cancer (BRCA) gene test is recommended for women who have family members with BRCA-related cancers. Results of the assessment will determine the need for genetic counseling and BRCA1 and for BRCA2 testing. BRCA-related cancers include these types: ? Breast. This occurs in males or females. ? Ovarian. ? Tubal. This may also be called fallopian tube cancer. ? Cancer of the abdominal or pelvic lining (peritoneal cancer). ? Prostate. ? Pancreatic.  Cervical, Uterine, and Ovarian Cancer Your health care provider may recommend that you be screened regularly for cancer of the pelvic organs. These include your ovaries, uterus, and vagina. This screening involves a pelvic exam, which includes checking for microscopic changes to the surface of your cervix (Pap test).  For women ages 21-65, health care providers may recommend a pelvic exam and a Pap test every three years. For women ages 105-65, they may recommend the Pap test and pelvic exam, combined with testing for human papilloma virus (HPV), every five years. Some types of HPV increase your risk of cervical cancer. Testing for HPV may also be done on women of any age who have unclear Pap test results.  Other health care providers may not recommend any screening for nonpregnant women who are considered low risk for pelvic cancer and have no symptoms. Ask your health care provider if a screening pelvic exam is right for you.  If you have had past treatment for cervical  cancer or a condition that could lead to cancer, you need Pap tests and screening for cancer for at least 20 years after your treatment. If Pap tests have been discontinued for you, your risk factors (such as having a new sexual partner) need to be reassessed to determine if you should start having screenings again. Some women have medical problems that increase the chance of getting cervical cancer. In these cases, your health care provider may recommend that you have screening and Pap tests more often.  If you have a family history of uterine cancer or ovarian cancer, talk with your health care provider about genetic screening.  If you have vaginal bleeding after reaching menopause, tell your health care provider.  There are currently no reliable tests available to screen for ovarian cancer.  Lung Cancer Lung cancer screening is recommended for adults 59-39 years old who are at high risk for lung cancer because of a history of smoking. A yearly low-dose CT scan of the lungs is recommended if you:  Currently smoke.  Have a history of at least 30 pack-years of smoking and you currently smoke or have quit within the past 15 years. A pack-year is smoking an average of one pack of cigarettes per day for one year.  Yearly screening should:  Continue until it has been 15 years since you quit.  Stop if you develop a health problem that would prevent you from having lung cancer treatment.  Colorectal Cancer  This type of cancer can be detected and can often be prevented.  Routine colorectal cancer screening usually begins at age 49 and continues through age 70.  If you have risk factors for colon cancer, your health care provider may recommend that you be screened at an earlier age.  If you have a family history of colorectal cancer, talk with your health care provider about genetic screening.  Your health care provider may also recommend using home test kits to check for hidden blood in your  stool.  A small camera at the end of a tube can be used to examine your colon directly (sigmoidoscopy or colonoscopy). This is done to check for the earliest forms of colorectal cancer.  Direct examination of the colon should be repeated every 5-10 years until age 38. However, if early forms of precancerous polyps or small growths are found or if you have a family history or genetic risk for colorectal cancer, you may need to be screened more often.  Skin Cancer  Check your skin from head to toe regularly.  Monitor any moles. Be sure to tell your health care provider: ? About any new moles or changes in moles, especially if there is a change in a mole's shape or color. ? If you have a mole that is larger than the size of a pencil eraser.  If any of your family members has a history of skin cancer, especially at a young age, talk with your health care provider about genetic screening.  Always use sunscreen. Apply sunscreen liberally and repeatedly throughout the day.  Whenever you are outside, protect yourself by wearing long sleeves, pants, a wide-brimmed hat, and sunglasses.  What should I know about osteoporosis? Osteoporosis is a condition in which bone destruction happens more quickly than new bone creation. After menopause, you may be at an increased risk for osteoporosis. To help prevent osteoporosis or the bone fractures that can happen because of osteoporosis, the following is recommended:  If you are 21-57 years old, get at least 1,000 mg of calcium and at least 600 mg of vitamin D per day.  If you are older than age 48 but younger than age 71, get at least 1,200 mg of calcium and at least 600 mg of vitamin D per day.  If you are older than age 50, get at least 1,200 mg of calcium and at least 800 mg of vitamin D per day.  Smoking and excessive alcohol intake increase the risk of osteoporosis. Eat foods that are rich in calcium and vitamin D, and do weight-bearing exercises  several times each week as directed by your health care provider. What should I know about how menopause affects my mental health? Depression may occur at any age, but it is more common as you become older. Common symptoms of depression include:  Low or sad mood.  Changes in sleep patterns.  Changes in appetite or eating patterns.  Feeling an overall lack of motivation or enjoyment of activities that you previously enjoyed.  Frequent crying spells.  Talk with your health care provider if you think that you are experiencing depression. What should I know about immunizations? It is important that you get and maintain your immunizations. These include:  Tetanus, diphtheria, and pertussis (Tdap) booster vaccine.  Influenza every year before the  flu season begins.  Pneumonia vaccine.  Shingles vaccine.  Your health care provider may also recommend other immunizations. This information is not intended to replace advice given to you by your health care provider. Make sure you discuss any questions you have with your health care provider. Document Released: 04/20/2005 Document Revised: 09/16/2015 Document Reviewed: 11/30/2014 Elsevier Interactive Patient Education  2018 [employer].   IF you received an x-ray today, you will receive an invoice from [facility]. Please contact [facility] at [phone] with questions or concerns regarding your invoice.   IF you received labwork today, you will receive an invoice from [facility]. Please contact [facility] at [phone] with questions or concerns regarding your invoice.   Our billing staff will not be able to assist you with questions regarding bills from these companies.  You will be contacted with the lab results as soon as they are available. The fastest way to get your results is to activate your My Chart account. Instructions are located on the last page of this paperwork. If you have not heard from [place] regarding  the results in 2 weeks, please contact this office.

## 2018-05-27 ENCOUNTER — Encounter: Payer: Self-pay | Admitting: Family Medicine

## 2018-05-27 ENCOUNTER — Ambulatory Visit (INDEPENDENT_AMBULATORY_CARE_PROVIDER_SITE_OTHER): Payer: Medicare Other | Admitting: Family Medicine

## 2018-05-27 ENCOUNTER — Other Ambulatory Visit: Payer: Self-pay

## 2018-05-27 VITALS — BP 122/80 | HR 62 | Temp 98.0°F | Ht 67.13 in | Wt 151.0 lb

## 2018-05-27 DIAGNOSIS — Z1329 Encounter for screening for other suspected endocrine disorder: Secondary | ICD-10-CM | POA: Diagnosis not present

## 2018-05-27 DIAGNOSIS — E785 Hyperlipidemia, unspecified: Secondary | ICD-10-CM

## 2018-05-27 DIAGNOSIS — Z1321 Encounter for screening for nutritional disorder: Secondary | ICD-10-CM | POA: Diagnosis not present

## 2018-05-27 DIAGNOSIS — Z130 Encounter for screening for diseases of the blood and blood-forming organs and certain disorders involving the immune mechanism: Secondary | ICD-10-CM | POA: Diagnosis not present

## 2018-05-27 DIAGNOSIS — G4700 Insomnia, unspecified: Secondary | ICD-10-CM

## 2018-05-27 DIAGNOSIS — R319 Hematuria, unspecified: Secondary | ICD-10-CM

## 2018-05-27 DIAGNOSIS — Z13228 Encounter for screening for other metabolic disorders: Secondary | ICD-10-CM | POA: Diagnosis not present

## 2018-05-27 LAB — POC MICROSCOPIC URINALYSIS (UMFC): Mucus: ABSENT

## 2018-05-27 LAB — POCT URINALYSIS DIP (MANUAL ENTRY)
Bilirubin, UA: NEGATIVE
GLUCOSE UA: NEGATIVE mg/dL
Ketones, POC UA: NEGATIVE mg/dL
LEUKOCYTES UA: NEGATIVE
NITRITE UA: NEGATIVE
Protein Ur, POC: NEGATIVE mg/dL
Spec Grav, UA: 1.02 (ref 1.010–1.025)
UROBILINOGEN UA: 0.2 U/dL
pH, UA: 5.5 (ref 5.0–8.0)

## 2018-05-27 NOTE — Progress Notes (Signed)
Subjective:    Patient ID: Patty Martinez, female    DOB: 1960/06/07, 58 y.o.   MRN: [id number]  HPI Patty Martinez is a 58 y.o. female Presents today for: Chief Complaint  Patient presents with  . blood draw    would like lab work done today   Here for bloodwork. Urine test, cholesterol, blood sugar.   Annual wellness exam 11/19/17  Depression screen [facility] 2/9 05/27/2018 11/19/2017 05/21/2017 02/27/2017 12/11/2016  Decreased Interest 0 0 0 0 3  Down, Depressed, Hopeless 0 0 0 0 3  PHQ - 2 Score 0 0 0 0 6  Altered sleeping - - - - 3  Tired, decreased energy - - - - 1  Change in appetite - - - - 0  Feeling bad or failure about yourself  - - - - 1  Trouble concentrating - - - - 1  Moving slowly or fidgety/restless - - - - 0  Suicidal thoughts - - - - 0  PHQ-9 Score - - - - 12  Difficult doing work/chores - - - - Somewhat difficult     Hyperlipidemia:  Lab Results  Component Value Date   CHOL 199 11/19/2017   HDL 63 11/19/2017   LDLCALC 119 (H) 11/19/2017   TRIG 87 11/19/2017   CHOLHDL 3.2 11/19/2017   Lab Results  Component Value Date   ALT 18 11/19/2017   AST 23 11/19/2017   ALKPHOS 63 11/19/2017   BILITOT 0.6 11/19/2017  pravastatin '40mg'$  qd - option off medicine - see lab notes. Off pravastatin for 1 month.   Chronic insomnia: For years. No regular meds. No supplements. Sometimes sleeps ok. Sometimes difficulty getting to sleep - once asleep does well.    Hematuria: None noted.  No dysuria, no abd pain , no known kidney stones. Occasional pain in R low back- notes at bedtime at times.   Results for orders placed or performed in visit on 05/27/18  POCT urinalysis dipstick  Result Value Ref Range   Color, UA yellow yellow   Clarity, UA clear clear   Glucose, UA negative negative mg/dL   Bilirubin, UA negative negative   Ketones, POC UA negative negative mg/dL   Spec Grav, UA 1.020 1.010 - 1.025   Blood, UA moderate (A) negative   pH, UA 5.5 5.0 - 8.0   Protein Ur, POC negative negative mg/dL   Urobilinogen, UA 0.2 0.2 or 1.0 E.U./dL   Nitrite, UA Negative Negative   Leukocytes, UA Negative Negative  POCT Microscopic Urinalysis ([facility])  Result Value Ref Range   WBC,UR,HPF,POC None None WBC/hpf   RBC,UR,HPF,POC Moderate (A) None RBC/hpf   Bacteria Moderate (A) None, Too numerous to count   Mucus Absent Absent   Epithelial Cells, UR Per Microscopy Moderate (A) None, Too numerous to count cells/hpf   Review of Systems  Gastrointestinal: Negative for abdominal pain.  Genitourinary: Negative for decreased urine volume, difficulty urinating, dysuria, frequency, hematuria and vaginal bleeding.       Objective:   Physical Exam Vitals signs reviewed.  Constitutional:      Appearance: Normal appearance. She is well-developed.  HENT:     Head: Normocephalic and atraumatic.  Eyes:     Conjunctiva/sclera: Conjunctivae normal.     Pupils: Pupils are equal, round, and reactive to light.  Neck:     Vascular: No carotid bruit.  Cardiovascular:     Rate and Rhythm: Normal rate and regular rhythm.     Heart sounds: Normal heart  sounds.  Pulmonary:     Effort: Pulmonary effort is normal.     Breath sounds: Normal breath sounds.  Abdominal:     General: There is no distension.     Palpations: Abdomen is soft. There is no pulsatile mass.     Tenderness: There is no abdominal tenderness. There is no guarding or rebound.  Skin:    General: Skin is warm and dry.  Neurological:     Mental Status: She is alert and oriented to person, place, and time.  Psychiatric:        Behavior: Behavior normal.    Vitals:   05/27/18 1024  BP: 122/80  Pulse: 62  Temp: 98 F (36.7 C)  TempSrc: Oral  SpO2: 98%  Weight: 151 lb (68.5 kg)  Height: 5' 7.13" (1.705 m)      Assessment & Plan:   [name] is a 58 y.o. female Hyperlipidemia, unspecified hyperlipidemia type - Plan: Lipid panel  -  Stable, tolerating current regimen. Medications  refilled. Labs pending as above.   Screening for endocrine, nutritional, metabolic and immunity disorder - Plan: CMP14+EGFR  Screening for thyroid disorder - Plan: TSH  Hematuria, unspecified type - Plan: POCT urinalysis dipstick, POCT Microscopic Urinalysis ([facility]), Ambulatory referral to Urology, Urine Culture  -Persistent hematuria, will refer to urology to discuss further work-up.  Urine culture obtained without signs of infection.  Recommended melatonin for sleep.  Handout given.  Follow-up with primary care provider.   No orders of the defined types were placed in this encounter.  Patient Instructions   Try melatonin for sleep. See other info below. Follow up with Dr. Pamella Pert to discuss further.  I will refer you to urologist to discuss the blood in the urine.  I will also check a urine culture but unlikely infection.  I will check your cholesterol test and other screening blood work off the cholesterol medication.  Please follow-up with Dr. Pamella Pert in the next 6 weeks to review symptoms further and bring a list of other concerns if needed at that time so you can come up with a plan on evaluation.  Thank you for coming in today.    Insomnia Insomnia is a sleep disorder that makes it difficult to fall asleep or stay asleep. Insomnia can cause fatigue, low energy, difficulty concentrating, mood swings, and poor performance at work or school. There are three different ways to classify insomnia:  Difficulty falling asleep.  Difficulty staying asleep.  Waking up too early in the morning. Any type of insomnia can be long-term (chronic) or short-term (acute). Both are common. Short-term insomnia usually lasts for three months or less. Chronic insomnia occurs at least three times a week for longer than three months. What are the causes? Insomnia may be caused by another condition, situation, or substance, such as:  Anxiety.  Certain medicines.  Gastroesophageal reflux disease  (GERD) or other gastrointestinal conditions.  Asthma or other breathing conditions.  Restless legs syndrome, sleep apnea, or other sleep disorders.  Chronic pain.  Menopause.  Stroke.  Abuse of alcohol, tobacco, or illegal drugs.  Mental health conditions, such as depression.  Caffeine.  Neurological disorders, such as Alzheimer's disease.  An overactive thyroid (hyperthyroidism). Sometimes, the cause of insomnia may not be known. What increases the risk? Risk factors for insomnia include:  Gender. Women are affected more often than men.  Age. Insomnia is more common as you get older.  Stress.  Lack of exercise.  Irregular work schedule or working  night shifts.  Traveling between different time zones.  Certain medical and mental health conditions. What are the signs or symptoms? If you have insomnia, the main symptom is having trouble falling asleep or having trouble staying asleep. This may lead to other symptoms, such as:  Feeling fatigued or having low energy.  Feeling nervous about going to sleep.  Not feeling rested in the morning.  Having trouble concentrating.  Feeling irritable, anxious, or depressed. How is this diagnosed? This condition may be diagnosed based on:  Your symptoms and medical history. Your health care provider may ask about: ? Your sleep habits. ? Any medical conditions you have. ? Your mental health.  A physical exam. How is this treated? Treatment for insomnia depends on the cause. Treatment may focus on treating an underlying condition that is causing insomnia. Treatment may also include:  Medicines to help you sleep.  Counseling or therapy.  Lifestyle adjustments to help you sleep better. Follow these instructions at home: Eating and drinking   Limit or avoid alcohol, caffeinated beverages, and cigarettes, especially close to bedtime. These can disrupt your sleep.  Do not eat a large meal or eat spicy foods right  before bedtime. This can lead to digestive discomfort that can make it hard for you to sleep. Sleep habits   Keep a sleep diary to help you and your health care provider figure out what could be causing your insomnia. Write down: ? When you sleep. ? When you wake up during the night. ? How well you sleep. ? How rested you feel the next day. ? Any side effects of medicines you are taking. ? What you eat and drink.  Make your bedroom a dark, comfortable place where it is easy to fall asleep. ? Put up shades or blackout curtains to block light from outside. ? Use a white noise machine to block noise. ? Keep the temperature cool.  Limit screen use before bedtime. This includes: ? Watching TV. ? Using your smartphone, tablet, or computer.  Stick to a routine that includes going to bed and waking up at the same times every day and night. This can help you fall asleep faster. Consider making a quiet activity, such as reading, part of your nighttime routine.  Try to avoid taking naps during the day so that you sleep better at night.  Get out of bed if you are still awake after 15 minutes of trying to sleep. Keep the lights down, but try reading or doing a quiet activity. When you feel sleepy, go back to bed. General instructions  Take over-the-counter and prescription medicines only as told by your health care provider.  Exercise regularly, as told by your health care provider. Avoid exercise starting several hours before bedtime.  Use relaxation techniques to manage stress. Ask your health care provider to suggest some techniques that may work well for you. These may include: ? Breathing exercises. ? Routines to release muscle tension. ? Visualizing peaceful scenes.  Make sure that you drive carefully. Avoid driving if you feel very sleepy.  Keep all follow-up visits as told by your health care provider. This is important. Contact a health care provider if:  You are tired  throughout the day.  You have trouble in your daily routine due to sleepiness.  You continue to have sleep problems, or your sleep problems get worse. Get help right away if:  You have serious thoughts about hurting yourself or someone else. If you ever feel like you  may hurt yourself or others, or have thoughts about taking your own life, get help right away. You can go to your nearest emergency department or call:  Your local emergency services (911 in the [place]  A suicide crisis helpline, such as the [place] at 4092717054. This is open 24 hours a day. Summary  Insomnia is a sleep disorder that makes it difficult to fall asleep or stay asleep.  Insomnia can be long-term (chronic) or short-term (acute).  Treatment for insomnia depends on the cause. Treatment may focus on treating an underlying condition that is causing insomnia.  Keep a sleep diary to help you and your health care provider figure out what could be causing your insomnia. This information is not intended to replace advice given to you by your health care provider. Make sure you discuss any questions you have with your health care provider. Document Released: 02/24/2000 Document Revised: 12/06/2016 Document Reviewed: 12/06/2016 Elsevier Interactive Patient Education  2019 [employer].  Hematuria, Adult Hematuria is blood in the urine. Blood may be visible in the urine, or it may be identified with a test. This condition can be caused by infections of the bladder, urethra, kidney, or prostate. Other possible causes include:  Kidney stones.  Cancer of the urinary tract.  Too much calcium in the urine.  Conditions that are passed from parent to child (inherited conditions).  Exercise that requires a lot of energy. Infections can usually be treated with medicine, and a kidney stone usually will pass through your urine. If neither of these is the cause of your hematuria, more tests  may be needed to identify the cause of your symptoms. It is very important to tell your health care provider about any blood in your urine, even if it is painless or the blood stops without treatment. Blood in the urine, when it happens and then stops and then happens again, can be a symptom of a very serious condition, including cancer. There is no pain in the initial stages of many urinary cancers. Follow these instructions at home: Medicines  Take over-the-counter and prescription medicines only as told by your health care provider.  If you were prescribed an antibiotic medicine, take it as told by your health care provider. Do not stop taking the antibiotic even if you start to feel better. Eating and drinking  Drink enough fluid to keep your urine clear or pale yellow. It is recommended that you drink 3-4 quarts (2.8-3.8 L) a day. If you have been diagnosed with an infection, it is recommended that you drink cranberry juice in addition to large amounts of water.  Avoid caffeine, tea, and carbonated beverages. These tend to irritate the bladder.  Avoid alcohol because it may irritate the prostate (men). General instructions  If you have been diagnosed with a kidney stone, follow your health care provider's instructions about straining your urine to catch the stone.  Empty your bladder often. Avoid holding urine for long periods of time.  If you are female: ? After a bowel movement, wipe from front to back and use each piece of toilet paper only once. ? Empty your bladder before and after sex.  Pay attention to any changes in your symptoms. Tell your health care provider about any changes or any new symptoms.  It is your responsibility to get your test results. Ask your health care provider, or the department performing the test, when your results will be ready.  Keep all follow-up visits as told  by your health care provider. This is important. Contact a health care provider if:   You develop back pain.  You have a fever.  You have nausea or vomiting.  Your symptoms do not improve after 3 days.  Your symptoms get worse. Get help right away if:  You develop severe vomiting and are unable take medicine without vomiting.  You develop severe pain in your back or abdomen even though you are taking medicine.  You pass a large amount of blood in your urine.  You pass blood clots in your urine.  You feel very weak or like you might faint.  You faint. Summary  Hematuria is blood in the urine. It has many possible causes.  It is very important that you tell your health care provider about any blood in your urine, even if it is painless or the blood stops without treatment.  Take over-the-counter and prescription medicines only as told by your health care provider.  Drink enough fluid to keep your urine clear or pale yellow. This information is not intended to replace advice given to you by your health care provider. Make sure you discuss any questions you have with your health care provider. Document Released: 02/26/2005 Document Revised: 03/31/2016 Document Reviewed: 03/31/2016 Elsevier Interactive Patient Education  [employer].      If you have lab work done today you will be contacted with your lab results within the next 2 weeks.  If you have not heard from [place] then please contact us. The fastest way to get your results is to register for My Chart.   IF you received an x-ray today, you will receive an invoice from [facility]. Please contact [facility] at [phone] with questions or concerns regarding your invoice.   IF you received labwork today, you will receive an invoice from [place]. Please contact [facility] at [phone] with questions or concerns regarding your invoice.   Our billing staff will not be able to assist you with questions regarding bills from these companies.  You will be contacted with the lab  results as soon as they are available. The fastest way to get your results is to activate your My Chart account. Instructions are located on the last page of this paperwork. If you have not heard from [place] regarding the results in 2 weeks, please contact this office.       Signed,   Merri Ray, MD [facility].  05/31/18 1:25 PM

## 2018-05-27 NOTE — Patient Instructions (Addendum)
Try melatonin for sleep. See other info below. Follow up with Dr. Leretha Pol to discuss further.  I will refer you to urologist to discuss the blood in the urine.  I will also check a urine culture but unlikely infection.  I will check your cholesterol test and other screening blood work off the cholesterol medication.  Please follow-up with Dr. Leretha Pol in the next 6 weeks to review symptoms further and bring a list of other concerns if needed at that time so you can come up with a plan on evaluation.  Thank you for coming in today.    Insomnia Insomnia is a sleep disorder that makes it difficult to fall asleep or stay asleep. Insomnia can cause fatigue, low energy, difficulty concentrating, mood swings, and poor performance at work or school. There are three different ways to classify insomnia:  Difficulty falling asleep.  Difficulty staying asleep.  Waking up too early in the morning. Any type of insomnia can be long-term (chronic) or short-term (acute). Both are common. Short-term insomnia usually lasts for three months or less. Chronic insomnia occurs at least three times a week for longer than three months. What are the causes? Insomnia may be caused by another condition, situation, or substance, such as:  Anxiety.  Certain medicines.  Gastroesophageal reflux disease (GERD) or other gastrointestinal conditions.  Asthma or other breathing conditions.  Restless legs syndrome, sleep apnea, or other sleep disorders.  Chronic pain.  Menopause.  Stroke.  Abuse of alcohol, tobacco, or illegal drugs.  Mental health conditions, such as depression.  Caffeine.  Neurological disorders, such as Alzheimer's disease.  An overactive thyroid (hyperthyroidism). Sometimes, the cause of insomnia may not be known. What increases the risk? Risk factors for insomnia include:  Gender. Women are affected more often than men.  Age. Insomnia is more common as you get  older.  Stress.  Lack of exercise.  Irregular work schedule or working night shifts.  Traveling between different time zones.  Certain medical and mental health conditions. What are the signs or symptoms? If you have insomnia, the main symptom is having trouble falling asleep or having trouble staying asleep. This may lead to other symptoms, such as:  Feeling fatigued or having low energy.  Feeling nervous about going to sleep.  Not feeling rested in the morning.  Having trouble concentrating.  Feeling irritable, anxious, or depressed. How is this diagnosed? This condition may be diagnosed based on:  Your symptoms and medical history. Your health care provider may ask about: ? Your sleep habits. ? Any medical conditions you have. ? Your mental health.  A physical exam. How is this treated? Treatment for insomnia depends on the cause. Treatment may focus on treating an underlying condition that is causing insomnia. Treatment may also include:  Medicines to help you sleep.  Counseling or therapy.  Lifestyle adjustments to help you sleep better. Follow these instructions at home: Eating and drinking   Limit or avoid alcohol, caffeinated beverages, and cigarettes, especially close to bedtime. These can disrupt your sleep.  Do not eat a large meal or eat spicy foods right before bedtime. This can lead to digestive discomfort that can make it hard for you to sleep. Sleep habits   Keep a sleep diary to help you and your health care provider figure out what could be causing your insomnia. Write down: ? When you sleep. ? When you wake up during the night. ? How well you sleep. ? How rested you feel the next  day. ? Any side effects of medicines you are taking. ? What you eat and drink.  Make your bedroom a dark, comfortable place where it is easy to fall asleep. ? Put up shades or blackout curtains to block light from outside. ? Use a white noise machine to block  noise. ? Keep the temperature cool.  Limit screen use before bedtime. This includes: ? Watching TV. ? Using your smartphone, tablet, or computer.  Stick to a routine that includes going to bed and waking up at the same times every day and night. This can help you fall asleep faster. Consider making a quiet activity, such as reading, part of your nighttime routine.  Try to avoid taking naps during the day so that you sleep better at night.  Get out of bed if you are still awake after 15 minutes of trying to sleep. Keep the lights down, but try reading or doing a quiet activity. When you feel sleepy, go back to bed. General instructions  Take over-the-counter and prescription medicines only as told by your health care provider.  Exercise regularly, as told by your health care provider. Avoid exercise starting several hours before bedtime.  Use relaxation techniques to manage stress. Ask your health care provider to suggest some techniques that may work well for you. These may include: ? Breathing exercises. ? Routines to release muscle tension. ? Visualizing peaceful scenes.  Make sure that you drive carefully. Avoid driving if you feel very sleepy.  Keep all follow-up visits as told by your health care provider. This is important. Contact a health care provider if:  You are tired throughout the day.  You have trouble in your daily routine due to sleepiness.  You continue to have sleep problems, or your sleep problems get worse. Get help right away if:  You have serious thoughts about hurting yourself or someone else. If you ever feel like you may hurt yourself or others, or have thoughts about taking your own life, get help right away. You can go to your nearest emergency department or call:  Your local emergency services (911 in the [place]  A suicide crisis helpline, such as the National Suicide Prevention Lifeline at 548-252-8143. This is open 24 hours a  day. Summary  Insomnia is a sleep disorder that makes it difficult to fall asleep or stay asleep.  Insomnia can be long-term (chronic) or short-term (acute).  Treatment for insomnia depends on the cause. Treatment may focus on treating an underlying condition that is causing insomnia.  Keep a sleep diary to help you and your health care provider figure out what could be causing your insomnia. This information is not intended to replace advice given to you by your health care provider. Make sure you discuss any questions you have with your health care provider. Document Released: 02/24/2000 Document Revised: 12/06/2016 Document Reviewed: 12/06/2016 Elsevier Interactive Patient Education  2019 [employer]  Hematuria, Adult Hematuria is blood in the urine. Blood may be visible in the urine, or it may be identified with a test. This condition can be caused by infections of the bladder, urethra, kidney, or prostate. Other possible causes include:  Kidney stones.  Cancer of the urinary tract.  Too much calcium in the urine.  Conditions that are passed from parent to child (inherited conditions).  Exercise that requires a lot of energy. Infections can usually be treated with medicine, and a kidney stone usually will pass through your urine. If neither of these is the  cause of your hematuria, more tests may be needed to identify the cause of your symptoms. It is very important to tell your health care provider about any blood in your urine, even if it is painless or the blood stops without treatment. Blood in the urine, when it happens and then stops and then happens again, can be a symptom of a very serious condition, including cancer. There is no pain in the initial stages of many urinary cancers. Follow these instructions at home: Medicines  Take over-the-counter and prescription medicines only as told by your health care provider.  If you were prescribed an antibiotic medicine, take  it as told by your health care provider. Do not stop taking the antibiotic even if you start to feel better. Eating and drinking  Drink enough fluid to keep your urine clear or pale yellow. It is recommended that you drink 3-4 quarts (2.8-3.8 L) a day. If you have been diagnosed with an infection, it is recommended that you drink cranberry juice in addition to large amounts of water.  Avoid caffeine, tea, and carbonated beverages. These tend to irritate the bladder.  Avoid alcohol because it may irritate the prostate (men). General instructions  If you have been diagnosed with a kidney stone, follow your health care provider's instructions about straining your urine to catch the stone.  Empty your bladder often. Avoid holding urine for long periods of time.  If you are female: ? After a bowel movement, wipe from front to back and use each piece of toilet paper only once. ? Empty your bladder before and after sex.  Pay attention to any changes in your symptoms. Tell your health care provider about any changes or any new symptoms.  It is your responsibility to get your test results. Ask your health care provider, or the department performing the test, when your results will be ready.  Keep all follow-up visits as told by your health care provider. This is important. Contact a health care provider if:  You develop back pain.  You have a fever.  You have nausea or vomiting.  Your symptoms do not improve after 3 days.  Your symptoms get worse. Get help right away if:  You develop severe vomiting and are unable take medicine without vomiting.  You develop severe pain in your back or abdomen even though you are taking medicine.  You pass a large amount of blood in your urine.  You pass blood clots in your urine.  You feel very weak or like you might faint.  You faint. Summary  Hematuria is blood in the urine. It has many possible causes.  It is very important that you tell  your health care provider about any blood in your urine, even if it is painless or the blood stops without treatment.  Take over-the-counter and prescription medicines only as told by your health care provider.  Drink enough fluid to keep your urine clear or pale yellow. This information is not intended to replace advice given to you by your health care provider. Make sure you discuss any questions you have with your health care provider. Document Released: 02/26/2005 Document Revised: 03/31/2016 Document Reviewed: 03/31/2016 Elsevier Interactive Patient Education  [employer].      If you have lab work done today you will be contacted with your lab results within the next 2 weeks.  If you have not heard from [place] then please contact us. The fastest way to get your results is to  register for My Chart.   IF you received an x-ray today, you will receive an invoice from [facility]. Please contact [facility] at [phone] with questions or concerns regarding your invoice.   IF you received labwork today, you will receive an invoice from [facility]. Please contact [facility] at [phone] with questions or concerns regarding your invoice.   Our billing staff will not be able to assist you with questions regarding bills from these companies.  You will be contacted with the lab results as soon as they are available. The fastest way to get your results is to activate your My Chart account. Instructions are located on the last page of this paperwork. If you have not heard from [place] regarding the results in 2 weeks, please contact this office.

## 2018-05-28 LAB — CMP14+EGFR
A/G RATIO: 1.8 (ref 1.2–2.2)
ALBUMIN: 4.8 g/dL (ref 3.8–4.9)
ALT: 13 IU/L (ref 0–32)
AST: 19 IU/L (ref 0–40)
Alkaline Phosphatase: 59 IU/L (ref 39–117)
BUN/Creatinine Ratio: 15 (ref 9–23)
BUN: 14 mg/dL (ref 6–24)
Bilirubin Total: 0.4 mg/dL (ref 0.0–1.2)
CALCIUM: 9.4 mg/dL (ref 8.7–10.2)
CO2: 20 mmol/L (ref 20–29)
Chloride: 104 mmol/L (ref 96–106)
Creatinine, Ser: 0.92 mg/dL (ref 0.57–1.00)
GFR calc Af Amer: 80 mL/min/{1.73_m2} (ref >59)
GFR calc non Af Amer: 69 mL/min/{1.73_m2} (ref >59)
Globulin, Total: 2.7 g/dL (ref 1.5–4.5)
Glucose: 99 mg/dL (ref 65–99)
Potassium: 4.2 mmol/L (ref 3.5–5.2)
Sodium: 143 mmol/L (ref 134–144)
TOTAL PROTEIN: 7.5 g/dL (ref 6.0–8.5)

## 2018-05-28 LAB — LIPID PANEL
CHOLESTEROL TOTAL: 290 mg/dL — AB (ref 100–199)
Chol/HDL Ratio: 4.5 ratio — ABNORMAL HIGH (ref 0.0–4.4)
HDL: 65 mg/dL (ref >39)
LDL Calculated: 209 mg/dL — ABNORMAL HIGH (ref 0–99)
TRIGLYCERIDES: 82 mg/dL (ref 0–149)
VLDL CHOLESTEROL CAL: 16 mg/dL (ref 5–40)

## 2018-05-28 LAB — URINE CULTURE: Organism ID, Bacteria: NO GROWTH

## 2018-05-28 LAB — TSH: TSH: 3.91 u[IU]/mL (ref 0.450–4.500)

## 2018-05-31 ENCOUNTER — Encounter: Payer: Self-pay | Admitting: Family Medicine

## 2018-06-24 DIAGNOSIS — R1031 Right lower quadrant pain: Secondary | ICD-10-CM | POA: Diagnosis not present

## 2018-06-24 DIAGNOSIS — R3121 Asymptomatic microscopic hematuria: Secondary | ICD-10-CM | POA: Diagnosis not present

## 2018-06-30 DIAGNOSIS — R3121 Asymptomatic microscopic hematuria: Secondary | ICD-10-CM | POA: Diagnosis not present

## 2018-06-30 DIAGNOSIS — R3129 Other microscopic hematuria: Secondary | ICD-10-CM | POA: Diagnosis not present

## 2018-07-10 DIAGNOSIS — R3121 Asymptomatic microscopic hematuria: Secondary | ICD-10-CM | POA: Diagnosis not present

## 2018-07-11 ENCOUNTER — Encounter: Payer: Self-pay | Admitting: Family Medicine

## 2018-07-11 ENCOUNTER — Other Ambulatory Visit: Payer: Self-pay

## 2018-07-11 ENCOUNTER — Ambulatory Visit (INDEPENDENT_AMBULATORY_CARE_PROVIDER_SITE_OTHER): Payer: Medicare Other | Admitting: Family Medicine

## 2018-07-11 VITALS — BP 123/75 | HR 60 | Temp 98.2°F | Ht 67.13 in | Wt 150.0 lb

## 2018-07-11 DIAGNOSIS — M5136 Other intervertebral disc degeneration, lumbar region: Secondary | ICD-10-CM

## 2018-07-11 DIAGNOSIS — F5104 Psychophysiologic insomnia: Secondary | ICD-10-CM | POA: Diagnosis not present

## 2018-07-11 DIAGNOSIS — E7800 Pure hypercholesterolemia, unspecified: Secondary | ICD-10-CM

## 2018-07-11 LAB — LIPID PANEL
Chol/HDL Ratio: 3.2 ratio (ref 0.0–4.4)
Cholesterol, Total: 190 mg/dL (ref 100–199)
HDL: 59 mg/dL (ref >39)
LDL Calculated: 114 mg/dL — ABNORMAL HIGH (ref 0–99)
Triglycerides: 86 mg/dL (ref 0–149)
VLDL Cholesterol Cal: 17 mg/dL (ref 5–40)

## 2018-07-11 MED ORDER — PRAVASTATIN SODIUM 40 MG PO TABS: 40.0000 mg | ORAL_TABLET | Freq: Every day | ORAL | 3 refills | Status: DC

## 2018-07-11 MED ORDER — NAPROXEN 500 MG PO TBEC: 500.0000 mg | DELAYED_RELEASE_TABLET | Freq: Two times a day (BID) | ORAL | 1 refills | Status: DC | PRN

## 2018-07-11 NOTE — Progress Notes (Signed)
5/1/202010:56 AM  Patty Martinez 1960-03-25, 58 y.o., female [id number]  Chief Complaint  Patient presents with  . Follow-up    Insomnia, still having some issue with sleeping. Maybe sleeps about 2 hours uninterrupted. Says she always thinking about her relatives that have passed on. Not requesting medication for this. Did not buy the melatonin suggested     HPI:   Patient is a 58 y.o. female with past medical history significant for HLP, hematuria, isnominia who presents today for followup on insomnia  Previous PCP Barnett Abu, [place] Last OV March 2020 with Dr Doran Heater done, referred to urology for persistent hematuria, recommended melatonin  Saw urology yesterday, had CT scan 2 weeks ago, overall reassuring workup  She reports that she had stopped taking pravastatin for about a month prior to most recent labs, since then she has restarted pravastatin, has been back on it for about a month  Has intermittent difficulty going back to sleep when she wakes up in the middle night to go to the restroom Her sister and mother both died last year She is overall ok, coping well with grief Declines any counseling at this time   Has intermittent back pain, known DDD from xray in 2018, naproxen works better than ibuprofen  Lab Results  Component Value Date   CHOL 290 (H) 05/27/2018   HDL 65 05/27/2018   LDLCALC 209 (H) 05/27/2018   TRIG 82 05/27/2018   CHOLHDL 4.5 (H) 05/27/2018    Fall Risk  07/11/2018 05/27/2018 11/19/2017 05/21/2017 02/27/2017  Falls in the past year? 0 0 Yes No No  Number falls in past yr: 0 0 1 - -  Comment - - left leg 10/2017 - -  Injury with Fall? 0 0 Yes - -     Depression screen [facility] 2/9 07/11/2018 05/27/2018 11/19/2017  Decreased Interest 0 0 0  Down, Depressed, Hopeless 0 0 0  PHQ - 2 Score 0 0 0  Altered sleeping - - -  Tired, decreased energy - - -  Change in appetite - - -  Feeling bad or failure about yourself  - - -  Trouble concentrating - - -   Moving slowly or fidgety/restless - - -  Suicidal thoughts - - -  PHQ-9 Score - - -  Difficult doing work/chores - - -    No Known Allergies  Prior to Admission medications   Medication Sig Start Date End Date Taking? Authorizing Provider  fluticasone (FLONASE) 50 MCG/ACT nasal spray Place 2 sprays into both nostrils daily. 11/19/17  Yes Barnett Abu, [place] D, PA-C  ibuprofen (ADVIL,MOTRIN) 200 MG tablet Take 200 mg by mouth every 6 (six) hours as needed.   Yes [provider]  Multiple Vitamin (MULTIVITAMIN) capsule Take 1 capsule by mouth daily.   Yes [provider]  pravastatin (PRAVACHOL) 40 MG tablet Take 1 tablet (40 mg total) by mouth daily. 11/19/17  Yes Magdalene River, PA-C    Past Medical History:  Diagnosis Date  . Arthritis     Past Surgical History:  Procedure Laterality Date  . KNEE SURGERY    . shoulder surgery  2010    Social History   Tobacco Use  . Smoking status: Never Smoker  . Smokeless tobacco: Never Used  Substance Use Topics  . Alcohol use: No    Alcohol/week: 0.0 standard drinks    Family History  Problem Relation Age of Onset  . Cancer Father        throat  .  Cancer - Lung Sister   . Colon cancer Neg Hx     Review of Systems  Constitutional: Negative for chills and fever.  Respiratory: Negative for cough and shortness of breath.   Cardiovascular: Negative for chest pain, palpitations and leg swelling.  Gastrointestinal: Negative for abdominal pain, nausea and vomiting.     OBJECTIVE:  Today's Vitals   07/11/18 1038  BP: 123/75  Pulse: 60  Temp: 98.2 F (36.8 C)  TempSrc: Oral  SpO2: 98%  Weight: 150 lb (68 kg)  Height: 5' 7.13" (1.705 m)   Body mass index is 23.4 kg/m.   Physical Exam Vitals signs and nursing note reviewed.  Constitutional:      Appearance: She is well-developed.  HENT:     Head: Normocephalic and atraumatic.  Eyes:     General: No scleral icterus.    Conjunctiva/sclera:  Conjunctivae normal.     Pupils: Pupils are equal, round, and reactive to light.  Neck:     Musculoskeletal: Neck supple.  Pulmonary:     Effort: Pulmonary effort is normal.  Skin:    General: Skin is warm and dry.  Neurological:     Mental Status: She is alert and oriented to person, place, and time.    ASSESSMENT and PLAN  1. Pure hypercholesterolemia Last lipid panel with very elevated LDL in setting of being off medication. Rechecking today. Meds to adjusted as needed.  - Lipid panel - pravastatin (PRAVACHOL) 40 MG tablet; Take 1 tablet (40 mg total) by mouth daily.  2. Degenerative disc disease, lumbar Stable. Requesting naproxen which takes prn. Reviewed med r/se/b.  3. 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 Overall coping well. Declines any counseling or medications at this time.  Other orders - naproxen (NAPROXEN DR) 500 MG EC tablet; Take 1 tablet (500 mg total) by mouth 2 (two) times daily as needed. With food or milk  Return in about 6 months (around 01/11/2019).    Myles LippsIrma M Santiago, MD Primary Care at [facility] [address] [place], [place] [address] Ph.  [phone] Fax [phone]

## 2018-07-11 NOTE — Patient Instructions (Signed)
° ° ° °  If you have lab work done today you will be contacted with your lab results within the next 2 weeks.  If you have not heard from us then please contact us. The fastest way to get your results is to register for My Chart. ° ° °IF you received an x-ray today, you will receive an invoice from [facility]. Please contact [facility] at [phone] with questions or concerns regarding your invoice.  ° °IF you received labwork today, you will receive an invoice from [facility]. Please contact [facility] at [phone] with questions or concerns regarding your invoice.  ° °Our billing staff will not be able to assist you with questions regarding bills from these companies. ° °You will be contacted with the lab results as soon as they are available. The fastest way to get your results is to activate your My Chart account. Instructions are located on the last page of this paperwork. If you have not heard from us regarding the results in 2 weeks, please contact this office. °  ° ° ° °

## 2018-12-04 ENCOUNTER — Ambulatory Visit (INDEPENDENT_AMBULATORY_CARE_PROVIDER_SITE_OTHER): Payer: Medicare Other | Admitting: Family Medicine

## 2018-12-04 ENCOUNTER — Other Ambulatory Visit: Payer: Self-pay

## 2018-12-04 DIAGNOSIS — Z23 Encounter for immunization: Secondary | ICD-10-CM

## 2018-12-08 DIAGNOSIS — Z01419 Encounter for gynecological examination (general) (routine) without abnormal findings: Secondary | ICD-10-CM | POA: Diagnosis not present

## 2018-12-08 DIAGNOSIS — Z1231 Encounter for screening mammogram for malignant neoplasm of breast: Secondary | ICD-10-CM | POA: Diagnosis not present

## 2018-12-08 DIAGNOSIS — Z124 Encounter for screening for malignant neoplasm of cervix: Secondary | ICD-10-CM | POA: Diagnosis not present

## 2018-12-10 LAB — HM PAP SMEAR: HM Pap smear: NORMAL

## 2018-12-16 ENCOUNTER — Other Ambulatory Visit: Payer: Self-pay

## 2019-01-12 ENCOUNTER — Encounter: Payer: Self-pay | Admitting: Family Medicine

## 2019-01-12 ENCOUNTER — Other Ambulatory Visit: Payer: Self-pay

## 2019-01-12 ENCOUNTER — Ambulatory Visit (INDEPENDENT_AMBULATORY_CARE_PROVIDER_SITE_OTHER): Payer: Medicare Other | Admitting: Family Medicine

## 2019-01-12 VITALS — BP 122/79 | HR 62 | Temp 97.7°F | Resp 16 | Ht 67.0 in | Wt 147.0 lb

## 2019-01-12 DIAGNOSIS — E7800 Pure hypercholesterolemia, unspecified: Secondary | ICD-10-CM

## 2019-01-12 DIAGNOSIS — Z0000 Encounter for general adult medical examination without abnormal findings: Secondary | ICD-10-CM

## 2019-01-12 DIAGNOSIS — Z0001 Encounter for general adult medical examination with abnormal findings: Secondary | ICD-10-CM | POA: Diagnosis not present

## 2019-01-12 MED ORDER — PRAVASTATIN SODIUM 40 MG PO TABS: 40.0000 mg | ORAL_TABLET | Freq: Every day | ORAL | 3 refills | Status: DC

## 2019-01-12 NOTE — Patient Instructions (Addendum)
   If you have lab work done today you will be contacted with your lab results within the next 2 weeks.  If you have not heard from us then please contact us. The fastest way to get your results is to register for My Chart.   IF you received an x-ray today, you will receive an invoice from [facility]. Please contact [facility] at [phone] with questions or concerns regarding your invoice.   IF you received labwork today, you will receive an invoice from [facility]. Please contact [facility] at [phone] with questions or concerns regarding your invoice.   Our billing staff will not be able to assist you with questions regarding bills from these companies.  You will be contacted with the lab results as soon as they are available. The fastest way to get your results is to activate your My Chart account. Instructions are located on the last page of this paperwork. If you have not heard from us regarding the results in 2 weeks, please contact this office.     Preventive Care 40-64 Years Old, Female Preventive care refers to visits with your health care provider and lifestyle choices that can promote health and wellness. This includes:  A yearly physical exam. This may also be called an annual well check.  Regular dental visits and eye exams.  Immunizations.  Screening for certain conditions.  Healthy lifestyle choices, such as eating a healthy diet, getting regular exercise, not using drugs or products that contain nicotine and tobacco, and limiting alcohol use. What can I expect for my preventive care visit? Physical exam Your health care provider will check your:  Height and weight. This may be used to calculate body mass index (BMI), which tells if you are at a healthy weight.  Heart rate and blood pressure.  Skin for abnormal spots. Counseling Your health care provider may ask you questions about your:  Alcohol, tobacco, and drug use.  Emotional  well-being.  Home and relationship well-being.  Sexual activity.  Eating habits.  Work and work environment.  Method of birth control.  Menstrual cycle.  Pregnancy history. What immunizations do I need?  Influenza (flu) vaccine  This is recommended every year. Tetanus, diphtheria, and pertussis (Tdap) vaccine  You may need a Td booster every 10 years. Varicella (chickenpox) vaccine  You may need this if you have not been vaccinated. Zoster (shingles) vaccine  You may need this after age 60. Measles, mumps, and rubella (MMR) vaccine  You may need at least one dose of MMR if you were born in 1957 or later. You may also need a second dose. Pneumococcal conjugate (PCV13) vaccine  You may need this if you have certain conditions and were not previously vaccinated. Pneumococcal polysaccharide (PPSV23) vaccine  You may need one or two doses if you smoke cigarettes or if you have certain conditions. Meningococcal conjugate (MenACWY) vaccine  You may need this if you have certain conditions. Hepatitis A vaccine  You may need this if you have certain conditions or if you travel or work in places where you may be exposed to hepatitis A. Hepatitis B vaccine  You may need this if you have certain conditions or if you travel or work in places where you may be exposed to hepatitis B. Haemophilus influenzae type b (Hib) vaccine  You may need this if you have certain conditions. Human papillomavirus (HPV) vaccine  If recommended by your health care provider, you may need three doses over 6 months.   You may receive vaccines as individual doses or as more than one vaccine together in one shot (combination vaccines). Talk with your health care provider about the risks and benefits of combination vaccines. What tests do I need? Blood tests  Lipid and cholesterol levels. These may be checked every 5 years, or more frequently if you are over 50 years old.  Hepatitis C  test.  Hepatitis B test. Screening  Lung cancer screening. You may have this screening every year starting at age 55 if you have a 30-pack-year history of smoking and currently smoke or have quit within the past 15 years.  Colorectal cancer screening. All adults should have this screening starting at age 50 and continuing until age 75. Your health care provider may recommend screening at age 45 if you are at increased risk. You will have tests every 1-10 years, depending on your results and the type of screening test.  Diabetes screening. This is done by checking your blood sugar (glucose) after you have not eaten for a while (fasting). You may have this done every 1-3 years.  Mammogram. This may be done every 1-2 years. Talk with your health care provider about when you should start having regular mammograms. This may depend on whether you have a family history of breast cancer.  BRCA-related cancer screening. This may be done if you have a family history of breast, ovarian, tubal, or peritoneal cancers.  Pelvic exam and Pap test. This may be done every 3 years starting at age 21. Starting at age 30, this may be done every 5 years if you have a Pap test in combination with an HPV test. Other tests  Sexually transmitted disease (STD) testing.  Bone density scan. This is done to screen for osteoporosis. You may have this scan if you are at high risk for osteoporosis. Follow these instructions at home: Eating and drinking  Eat a diet that includes fresh fruits and vegetables, whole grains, lean protein, and low-fat dairy.  Take vitamin and mineral supplements as recommended by your health care provider.  Do not drink alcohol if: ? Your health care provider tells you not to drink. ? You are pregnant, may be pregnant, or are planning to become pregnant.  If you drink alcohol: ? Limit how much you have to 0-1 drink a day. ? Be aware of how much alcohol is in your drink. In the [place] one  drink equals one 12 oz bottle of beer (355 mL), one 5 oz glass of wine (148 mL), or one 1 oz glass of hard liquor (44 mL). Lifestyle  Take daily care of your teeth and gums.  Stay active. Exercise for at least 30 minutes on 5 or more days each week.  Do not use any products that contain nicotine or tobacco, such as cigarettes, e-cigarettes, and chewing tobacco. If you need help quitting, ask your health care provider.  If you are sexually active, practice safe sex. Use a condom or other form of birth control (contraception) in order to prevent pregnancy and STIs (sexually transmitted infections).  If told by your health care provider, take low-dose aspirin daily starting at age 50. What's next?  Visit your health care provider once a year for a well check visit.  Ask your health care provider how often you should have your eyes and teeth checked.  Stay up to date on all vaccines. This information is not intended to replace advice given to you by your health care provider. Make sure   you discuss any questions you have with your health care provider. Document Released: 03/25/2015 Document Revised: 11/07/2017 Document Reviewed: 11/07/2017 Elsevier Patient Education  2020 Elsevier Inc.  

## 2019-01-12 NOTE — Progress Notes (Signed)
Presents today for [employer] Visit-Subsequent.   Date of last exam: Sept 2019  Interpreter used for this visit? no  Patient Care Team: Rutherford Guys, MD as PCP - General (Family Medicine) Azucena Fallen, MD as Consulting Physician (Obstetrics and Gynecology)   Other items to address today: HLP, takes pravastatin, was restarted at last [place] in May 2020, prior LDL 209 On disability after work related injury to [place], unable to lift more than 2 lbs, employer was unable to find another job  Cancer Screening: Cervical: yes, Dr Benjie Karvonen, last [facility] Sept 2020 Breast: yes, Dr Benjie Karvonen, last [facility] Sept 2020 Colon: yes. Colonoscopy in 2017, repeat 10 years  Other Screening: Last screening for diabetes: May 2020 Last lipid screening: h/o HLP, on pravastatin Lab Results  Component Value Date   CHOL 190 07/11/2018   HDL 59 07/11/2018   LDLCALC 114 (H) 07/11/2018   TRIG 86 07/11/2018   CHOLHDL 3.2 07/11/2018    ADVANCE DIRECTIVES: Discussed: yes Patient desires CPR (unknown), mechanical ventilation (unknown), prolonged artificial support (may include mechanical ventilation, tube/PEG feeding, etc) (unknown). On File: no Materials Provided: no, patient declines  Immunization status:  Immunization History  Administered Date(s) Administered  . Influenza,inj,Quad PF,6+ Mos 12/03/2013, 11/02/2014, 11/08/2015, 11/13/2016, 11/19/2017, 12/04/2018  . Influenza-Unspecified 11/19/2017  . Tdap 11/17/2013     There are no preventive care reminders to display for this patient.   Functional Status Survey: Is the patient deaf or have difficulty hearing?: No Does the patient have difficulty seeing, even when wearing glasses/contacts?: No Does the patient have difficulty concentrating, remembering, or making decisions?: No Does the patient have difficulty walking or climbing stairs?: Yes(some left leg problems) Does the patient have difficulty dressing or bathing?: No Does the  patient have difficulty doing errands alone such as visiting a doctor's office or shopping?: No   Home Environment: lives with her husband, safe  Urinary Incontinence Screening: denies any issues  Patient Active Problem List   Diagnosis Date Noted  . Degenerative disc disease, lumbar 09/14/2016  . Hyperlipidemia 05/13/2014     Past Medical History:  Diagnosis Date  . Arthritis      Past Surgical History:  Procedure Laterality Date  . KNEE SURGERY    . shoulder surgery  2010     Family History  Problem Relation Age of Onset  . Cancer Father        throat  . Cancer - Lung Sister   . Colon cancer Neg Hx      Social History   Socioeconomic History  . Marital status: Married    Spouse name: Not on file  . Number of children: 2  . Years of education: Not on file  . Highest education level: Not on file  Occupational History  . Not on file  Social Needs  . Financial resource strain: Not on file  . Food insecurity    Worry: Not on file    Inability: Not on file  . Transportation needs    Medical: Not on file    Non-medical: Not on file  Tobacco Use  . Smoking status: Never Smoker  . Smokeless tobacco: Never Used  Substance and Sexual Activity  . Alcohol use: No    Alcohol/week: 0.0 standard drinks  . Drug use: No  . Sexual activity: Yes    Partners: Male    Comment: monogamous with husbnad  Lifestyle  . Physical activity    Days per week: 2 days  Minutes per session: 20 min  . Stress: Only a little  Relationships  . Social Herbalist on phone: Three times a week    Gets together: Twice a week    Attends religious service: More than 4 times per year    Active member of club or organization: Yes    Attends meetings of clubs or organizations: More than 4 times per year    Relationship status: Married  . Intimate partner violence    Fear of current or ex partner: No    Emotionally abused: No    Physically abused: No    Forced sexual  activity: No  Other Topics Concern  . Not on file  Social History Narrative   On disability. Lives at home with husband. Has two children who live close by.      No Known Allergies   Prior to Admission medications   Medication Sig Start Date End Date Taking? Authorizing Provider  fluticasone (FLONASE) 50 MCG/ACT nasal spray Place 2 sprays into both nostrils daily. 11/19/17  Yes Timmothy Euler, [place] D, PA-C  Multiple Vitamin (MULTIVITAMIN) capsule Take 1 capsule by mouth daily.   Yes [provider]  naproxen (NAPROXEN DR) 500 MG EC tablet Take 1 tablet (500 mg total) by mouth 2 (two) times daily as needed. With food or milk 07/11/18  Yes Rutherford Guys, MD  pravastatin (PRAVACHOL) 40 MG tablet Take 1 tablet (40 mg total) by mouth daily. 07/11/18  Yes Rutherford Guys, MD     Depression screen [facility] 2/9 01/12/2019 07/11/2018 05/27/2018 11/19/2017 05/21/2017  Decreased Interest 0 0 0 0 0  Down, Depressed, Hopeless 0 0 0 0 0  PHQ - 2 Score 0 0 0 0 0  Altered sleeping - - - - -  Tired, decreased energy - - - - -  Change in appetite - - - - -  Feeling bad or failure about yourself  - - - - -  Trouble concentrating - - - - -  Moving slowly or fidgety/restless - - - - -  Suicidal thoughts - - - - -  PHQ-9 Score - - - - -  Difficult doing work/chores - - - - -     Fall Risk  01/12/2019 07/11/2018 05/27/2018 11/19/2017 05/21/2017  Falls in the past year? 0 0 0 Yes No  Number falls in past yr: 0 0 0 1 -  Comment - - - left leg 10/2017 -  Injury with Fall? 0 0 0 Yes -    Review of Systems  Constitutional: Negative for chills and fever.  HENT: Positive for congestion.   Respiratory: Negative for cough and shortness of breath.   Cardiovascular: Negative for chest pain, palpitations and leg swelling.  Gastrointestinal: Negative for abdominal pain, nausea and vomiting.  Genitourinary: Negative for frequency and urgency.  Musculoskeletal: Positive for joint pain.  Neurological: Positive for  focal weakness and headaches.  Psychiatric/Behavioral: Positive for depression (grief). The patient has insomnia (improved).   All other systems reviewed and are negative.   PHYSICAL EXAM: BP 122/79   Pulse 62   Temp 97.7 F (36.5 C) (Oral)   Resp 16   Ht _0  (1.702 m)   Wt 147 lb (66.7 kg)   LMP 11/14/2012   SpO2 99%   BMI 23.02 kg/m    Wt Readings from Last 3 Encounters:  01/12/19 147 lb (66.7 kg)  07/11/18 150 lb (68 kg)  05/27/18 151 lb (68.5 kg)  Hearing Screening   _0  _1  _2  _3  _4  _5  _6  _7  _8   Right ear:           Left ear:             Visual Acuity Screening   Right eye Left eye Both eyes  Without correction:     With correction: _9      Physical Exam  Constitutional: She is oriented to person, place, and time and well-developed, well-nourished, and in no distress.  HENT:  Head: Normocephalic and atraumatic.  Right Ear: Hearing, tympanic membrane, external ear and ear canal normal.  Left Ear: Hearing, tympanic membrane, external ear and ear canal normal.  Mouth/Throat: Oropharynx is clear and moist.  Eyes: Pupils are equal, round, and reactive to light. EOM are normal.  Neck: Neck supple. No thyromegaly present.  Cardiovascular: Normal rate, regular rhythm, normal heart sounds and intact distal pulses. Exam reveals no gallop and no friction rub.  No murmur heard. Pulmonary/Chest: Effort normal and breath sounds normal. She has no wheezes. She has no rales.  Abdominal: Soft. Bowel sounds are normal. She exhibits no distension and no mass. There is no abdominal tenderness.  Musculoskeletal: Normal range of motion.        General: No edema.  Lymphadenopathy:    She has no cervical adenopathy.  Neurological: She is alert and oriented to person, place, and time. She has normal reflexes. No cranial nerve deficit. Gait normal.  Skin: Skin is warm and dry.  Psychiatric: Mood and affect normal.  Nursing note  and vitals reviewed.   Education/Counseling provided regarding diet and exercise, prevention of chronic diseases, smoking/tobacco cessation, if applicable, and reviewed "Covered Medicare Preventive Services."   ASSESSMENT/PLAN: 1. Medicare annual wellness visit, subsequent No concerns per history or exam. Routine HCM labs ordered. HCM reviewed/discussed. Anticipatory guidance regarding healthy weight, lifestyle and choices given.   2. Pure hypercholesterolemia Checking labs today, medications will be adjusted as needed.  - CMP14+EGFR - Lipid panel - pravastatin (PRAVACHOL) 40 MG tablet; Take 1 tablet (40 mg total) by mouth daily.

## 2019-01-13 LAB — CMP14+EGFR
ALT: 17 IU/L (ref 0–32)
AST: 29 IU/L (ref 0–40)
Albumin/Globulin Ratio: 2.1 (ref 1.2–2.2)
Albumin: 4.8 g/dL (ref 3.8–4.9)
Alkaline Phosphatase: 62 IU/L (ref 39–117)
BUN/Creatinine Ratio: 17 (ref 9–23)
BUN: 13 mg/dL (ref 6–24)
Bilirubin Total: 0.5 mg/dL (ref 0.0–1.2)
CO2: 21 mmol/L (ref 20–29)
Calcium: 9.3 mg/dL (ref 8.7–10.2)
Chloride: 105 mmol/L (ref 96–106)
Creatinine, Ser: 0.75 mg/dL (ref 0.57–1.00)
GFR calc Af Amer: 102 mL/min/{1.73_m2} (ref >59)
GFR calc non Af Amer: 88 mL/min/{1.73_m2} (ref >59)
Globulin, Total: 2.3 g/dL (ref 1.5–4.5)
Glucose: 96 mg/dL (ref 65–99)
Potassium: 4.2 mmol/L (ref 3.5–5.2)
Sodium: 139 mmol/L (ref 134–144)
Total Protein: 7.1 g/dL (ref 6.0–8.5)

## 2019-01-13 LAB — LIPID PANEL
Chol/HDL Ratio: 3.4 ratio (ref 0.0–4.4)
Cholesterol, Total: 216 mg/dL — ABNORMAL HIGH (ref 100–199)
HDL: 64 mg/dL (ref >39)
LDL Chol Calc (NIH): 133 mg/dL — ABNORMAL HIGH (ref 0–99)
Triglycerides: 105 mg/dL (ref 0–149)
VLDL Cholesterol Cal: 19 mg/dL (ref 5–40)

## 2019-01-14 DIAGNOSIS — M25561 Pain in right knee: Secondary | ICD-10-CM | POA: Diagnosis not present

## 2019-01-14 DIAGNOSIS — M545 Low back pain: Secondary | ICD-10-CM | POA: Diagnosis not present

## 2019-01-14 DIAGNOSIS — M542 Cervicalgia: Secondary | ICD-10-CM | POA: Diagnosis not present

## 2019-01-14 DIAGNOSIS — M7061 Trochanteric bursitis, right hip: Secondary | ICD-10-CM | POA: Diagnosis not present

## 2019-01-27 DIAGNOSIS — M5412 Radiculopathy, cervical region: Secondary | ICD-10-CM | POA: Diagnosis not present

## 2019-01-27 DIAGNOSIS — M5416 Radiculopathy, lumbar region: Secondary | ICD-10-CM | POA: Diagnosis not present

## 2019-01-29 DIAGNOSIS — M5412 Radiculopathy, cervical region: Secondary | ICD-10-CM | POA: Diagnosis not present

## 2019-01-29 DIAGNOSIS — M5416 Radiculopathy, lumbar region: Secondary | ICD-10-CM | POA: Diagnosis not present

## 2019-02-03 DIAGNOSIS — M5412 Radiculopathy, cervical region: Secondary | ICD-10-CM | POA: Diagnosis not present

## 2019-02-03 DIAGNOSIS — M5416 Radiculopathy, lumbar region: Secondary | ICD-10-CM | POA: Diagnosis not present

## 2019-02-09 ENCOUNTER — Other Ambulatory Visit: Payer: Self-pay | Admitting: Physician Assistant

## 2019-02-09 DIAGNOSIS — E7800 Pure hypercholesterolemia, unspecified: Secondary | ICD-10-CM

## 2019-02-10 DIAGNOSIS — M5412 Radiculopathy, cervical region: Secondary | ICD-10-CM | POA: Diagnosis not present

## 2019-02-10 DIAGNOSIS — M5416 Radiculopathy, lumbar region: Secondary | ICD-10-CM | POA: Diagnosis not present

## 2019-02-12 DIAGNOSIS — M5416 Radiculopathy, lumbar region: Secondary | ICD-10-CM | POA: Diagnosis not present

## 2019-02-12 DIAGNOSIS — M5412 Radiculopathy, cervical region: Secondary | ICD-10-CM | POA: Diagnosis not present

## 2019-02-17 DIAGNOSIS — M5416 Radiculopathy, lumbar region: Secondary | ICD-10-CM | POA: Diagnosis not present

## 2019-02-17 DIAGNOSIS — M5412 Radiculopathy, cervical region: Secondary | ICD-10-CM | POA: Diagnosis not present

## 2019-02-19 DIAGNOSIS — M5412 Radiculopathy, cervical region: Secondary | ICD-10-CM | POA: Diagnosis not present

## 2019-02-19 DIAGNOSIS — M5416 Radiculopathy, lumbar region: Secondary | ICD-10-CM | POA: Diagnosis not present

## 2019-03-17 DIAGNOSIS — M5412 Radiculopathy, cervical region: Secondary | ICD-10-CM | POA: Diagnosis not present

## 2019-03-17 DIAGNOSIS — M5416 Radiculopathy, lumbar region: Secondary | ICD-10-CM | POA: Diagnosis not present

## 2019-03-19 DIAGNOSIS — M5416 Radiculopathy, lumbar region: Secondary | ICD-10-CM | POA: Diagnosis not present

## 2019-03-19 DIAGNOSIS — M5412 Radiculopathy, cervical region: Secondary | ICD-10-CM | POA: Diagnosis not present

## 2019-03-24 DIAGNOSIS — M5412 Radiculopathy, cervical region: Secondary | ICD-10-CM | POA: Diagnosis not present

## 2019-03-24 DIAGNOSIS — M5416 Radiculopathy, lumbar region: Secondary | ICD-10-CM | POA: Diagnosis not present

## 2019-03-26 DIAGNOSIS — M5412 Radiculopathy, cervical region: Secondary | ICD-10-CM | POA: Diagnosis not present

## 2019-03-26 DIAGNOSIS — M5416 Radiculopathy, lumbar region: Secondary | ICD-10-CM | POA: Diagnosis not present

## 2019-04-03 ENCOUNTER — Ambulatory Visit: Payer: Medicare Other | Attending: Internal Medicine

## 2019-04-03 DIAGNOSIS — Z20822 Contact with and (suspected) exposure to covid-19: Secondary | ICD-10-CM

## 2019-04-04 LAB — NOVEL CORONAVIRUS, NAA: SARS-CoV-2, NAA: DETECTED — AB

## 2019-04-08 ENCOUNTER — Emergency Department (HOSPITAL_COMMUNITY): Payer: Medicare Other

## 2019-04-08 ENCOUNTER — Emergency Department (HOSPITAL_COMMUNITY)
Admission: EM | Admit: 2019-04-08 | Discharge: 2019-04-08 | Disposition: A | Discharge status: Home / Self Care | Payer: Medicare Other | Attending: Emergency Medicine | Admitting: Emergency Medicine

## 2019-04-08 DIAGNOSIS — R55 Syncope and collapse: Secondary | ICD-10-CM | POA: Diagnosis not present

## 2019-04-08 DIAGNOSIS — W19XXXA Unspecified fall, initial encounter: Secondary | ICD-10-CM

## 2019-04-08 DIAGNOSIS — U071 COVID-19: Secondary | ICD-10-CM | POA: Diagnosis not present

## 2019-04-08 DIAGNOSIS — Z79899 Other long term (current) drug therapy: Secondary | ICD-10-CM | POA: Diagnosis not present

## 2019-04-08 DIAGNOSIS — R001 Bradycardia, unspecified: Secondary | ICD-10-CM | POA: Diagnosis not present

## 2019-04-08 DIAGNOSIS — R0902 Hypoxemia: Secondary | ICD-10-CM | POA: Diagnosis not present

## 2019-04-08 DIAGNOSIS — S79912A Unspecified injury of left hip, initial encounter: Secondary | ICD-10-CM | POA: Diagnosis not present

## 2019-04-08 LAB — BASIC METABOLIC PANEL
Anion gap: 9 (ref 5–15)
BUN: 10 mg/dL (ref 6–20)
CO2: 21 mmol/L — ABNORMAL LOW (ref 22–32)
Calcium: 8.3 mg/dL — ABNORMAL LOW (ref 8.9–10.3)
Chloride: 108 mmol/L (ref 98–111)
Creatinine, Ser: 0.64 mg/dL (ref 0.44–1.00)
GFR calc Af Amer: >60 mL/min (ref >60)
GFR calc non Af Amer: >60 mL/min (ref >60)
Glucose, Bld: 101 mg/dL — ABNORMAL HIGH (ref 70–99)
Potassium: 3.7 mmol/L (ref 3.5–5.1)
Sodium: 138 mmol/L (ref 135–145)

## 2019-04-08 LAB — CBC WITH DIFFERENTIAL/PLATELET
Abs Immature Granulocytes: 0.01 10*3/uL (ref 0.00–0.07)
Basophils Absolute: 0 10*3/uL (ref 0.0–0.1)
Basophils Relative: 0 %
Eosinophils Absolute: 0 10*3/uL (ref 0.0–0.5)
Eosinophils Relative: 0 %
HCT: 37.7 % (ref 36.0–46.0)
Hemoglobin: 12.1 g/dL (ref 12.0–15.0)
Immature Granulocytes: 0 %
Lymphocytes Relative: 29 %
Lymphs Abs: 1.2 10*3/uL (ref 0.7–4.0)
MCH: 30.6 pg (ref 26.0–34.0)
MCHC: 32.1 g/dL (ref 30.0–36.0)
MCV: 95.2 fL (ref 80.0–100.0)
Monocytes Absolute: 0.5 10*3/uL (ref 0.1–1.0)
Monocytes Relative: 13 %
Neutro Abs: 2.4 10*3/uL (ref 1.7–7.7)
Neutrophils Relative %: 58 %
Platelets: 163 10*3/uL (ref 150–400)
RBC: 3.96 MIL/uL (ref 3.87–5.11)
RDW: 12.8 % (ref 11.5–15.5)
WBC: 4.1 10*3/uL (ref 4.0–10.5)
nRBC: 0 % (ref 0.0–0.2)

## 2019-04-08 MED ORDER — LACTATED RINGERS IV BOLUS
500.0000 mL | Freq: Once | INTRAVENOUS | Status: AC
  Administered 2019-04-08: 500 mL via INTRAVENOUS

## 2019-04-08 NOTE — ED Triage Notes (Signed)
Pt bib ems from home with reports of +syncope. Family heard pt fall and went to check pt and found her on the floor. Pt LOC lasted approx 1 minute. Pt pale on scene. Tested + covid on Friday last week.  120/70  12 lead unremarkable 52 SB 99% RA

## 2019-04-08 NOTE — Discharge Instructions (Signed)
Laboratory tests are reassuring.  Continue to drink fluids to stay hydrated.  Return to the ER for recurrent symptoms or shortness of breath.  You can take over-the-counter medications as needed for aches and pains

## 2019-04-08 NOTE — ED Provider Notes (Signed)
[facility] EMERGENCY DEPARTMENT Provider Note   CSN: [id number] Arrival date & time: 04/08/19  1014     History Syncope  Patty Martinez is a 59 y.o. female.  HPI   Patient presents to the emergency room for evaluation after a syncopal episode.  Patient was diagnosed with Covid on Friday of last week.  Patient states this morning she started to feel very hot and flushed.  Patient remembers calling her husband.  The next thing she remembers is being on the floor.  Family heard the patient fall and went to check on her and she was unresponsive.  This lasted for maybe a minute.  EMS arrived and noted the patient's heart rate was in the 50s.  Her blood pressure was 120/70.  Patient states she has been having some cough and congestion.  She is not having any shortness of breath.  No significant fevers.  No vomiting or diarrhea.  She denies any prior history of syncopal episodes.  No known seizure disorder.  Past Medical History:  Diagnosis Date  . Arthritis     Patient Active Problem List   Diagnosis Date Noted  . Degenerative disc disease, lumbar 09/14/2016  . Hyperlipidemia 05/13/2014    Past Surgical History:  Procedure Laterality Date  . KNEE SURGERY    . shoulder surgery  2010     OB History   No obstetric history on file.     Family History  Problem Relation Age of Onset  . Cancer Father        throat  . Cancer - Lung Sister   . Colon cancer Neg Hx     Social History   Tobacco Use  . Smoking status: Never Smoker  . Smokeless tobacco: Never Used  Substance Use Topics  . Alcohol use: No    Alcohol/week: 0.0 standard drinks  . Drug use: No    Home Medications Prior to Admission medications   Medication Sig Start Date End Date Taking? Authorizing Provider  fluticasone (FLONASE) 50 MCG/ACT nasal spray Place 2 sprays into both nostrils daily. 11/19/17   Tenna Delaine D, PA-C  Multiple Vitamin (MULTIVITAMIN) capsule Take 1 capsule by mouth  daily.    [provider]  naproxen (NAPROXEN DR) 500 MG EC tablet Take 1 tablet (500 mg total) by mouth 2 (two) times daily as needed. With food or milk 07/11/18   Rutherford Guys, MD  pravastatin (PRAVACHOL) 40 MG tablet Take 1 tablet (40 mg total) by mouth daily. 01/12/19   Rutherford Guys, MD    Allergies    Patient has no known allergies.  Review of Systems   Review of Systems  All other systems reviewed and are negative.   Physical Exam Updated Vital Signs BP 132/82   Pulse 62   Temp 98.5 F (36.9 C) (Oral)   Resp 19   LMP 11/14/2012   SpO2 99%   Physical Exam Vitals and nursing note reviewed.  Constitutional:      General: She is not in acute distress.    Appearance: She is well-developed.  HENT:     Head: Normocephalic and atraumatic.     Right Ear: External ear normal.     Left Ear: External ear normal.  Eyes:     General: No scleral icterus.       Right eye: No discharge.        Left eye: No discharge.     Conjunctiva/sclera: Conjunctivae normal.  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  Cardiovascular:     Rate and Rhythm: Normal rate and regular rhythm.  Pulmonary:     Effort: Pulmonary effort is normal. No respiratory distress.     Breath sounds: Normal breath sounds. No stridor. No wheezing or rales.  Abdominal:     General: Bowel sounds are normal. There is no distension.     Palpations: Abdomen is soft.     Tenderness: There is no abdominal tenderness. There is no guarding or rebound.  Musculoskeletal:        General: No tenderness.     Cervical back: Neck supple.  Skin:    General: Skin is warm and dry.     Findings: No rash.  Neurological:     Mental Status: She is alert.     Cranial Nerves: No cranial nerve deficit (no facial droop, extraocular movements intact, no slurred speech).     Sensory: No sensory deficit.     Motor: No abnormal muscle tone or  seizure activity.     Coordination: Coordination normal.     Comments: Alert and oriented, normal strength bilateral upper extremities, able to lift both legs off the bed, normal sensation     ED Results / Procedures / Treatments   Labs (all labs ordered are listed, but only abnormal results are displayed) Labs Reviewed  BASIC METABOLIC PANEL - Abnormal; Notable for the following components:      Result Value   CO2 21 (*)    Glucose, Bld 101 (*)    Calcium 8.3 (*)    All other components within normal limits  CBC WITH DIFFERENTIAL/PLATELET    EKG EKG Interpretation  Date/Time:  Wednesday April 08 2019 11:28:27 EST Ventricular Rate:  56 PR Interval:    QRS Duration: 94 QT Interval:  435 QTC Calculation: 420 R Axis:   53 Text Interpretation: Sinus rhythm No old tracing to compare Confirmed by Linwood Dibbles [phone]) on 04/08/2019 11:36:11 AM   Radiology DG Chest Portable 1 View  Result Date: 04/08/2019 CLINICAL DATA:  Syncope.  Found on floor EXAM: PORTABLE CHEST 1 VIEW COMPARISON:  None. FINDINGS: The heart size and mediastinal contours are within normal limits. Both lungs are clear. The visualized skeletal structures are unremarkable. IMPRESSION: No active disease. Electronically Signed   By: Marlan Palau M.D.   On: 04/08/2019 10:58    Procedures Procedures (including critical care time)  Medications Ordered in ED Medications  lactated ringers bolus 500 mL (0 mLs Intravenous Stopped 04/08/19 1500)    ED Course  I have reviewed the triage vital signs and the nursing notes.  Pertinent labs & imaging results that were available during my care of the patient were reviewed by me and considered in my medical decision making (see chart for details).  Clinical Course as of Apr 07 1553  Wed Apr 08, 2019  1412 Discussed findings with the patient.  She initially stated she was not having any pain or discomfort but she did note that when she tried to sit up she is having some  pain in in her left hip.  We will proceed with x-rays.   [JK]    Clinical Course User Index [JK] Linwood Dibbles, MD   MDM Rules/Calculators/A&P                     Patient presented after syncopal episode.  Symptoms most likely  vasovagal in nature.  Patient has recent Covid diagnosis but she is not having any hypoxia and her vital signs are reassuring.  Chest x-ray and laboratory tests are unremarkable.  At this time there does not appear to be any evidence of an acute emergency medical condition and the patient appears stable for discharge with appropriate outpatient follow up.  Xrays pending.  Prelim review no fx.  Will review formal result  Final Clinical Impression(s) / ED Diagnoses Final diagnoses:  Vasovagal syncope  COVID-19 virus infection    Rx / DC Orders ED Discharge Orders    None       Linwood Dibbles, MD 04/08/19 1556

## 2019-04-08 NOTE — ED Notes (Signed)
Pt does complain of "flushed" feeling in her chest.

## 2019-05-25 DIAGNOSIS — M5416 Radiculopathy, lumbar region: Secondary | ICD-10-CM | POA: Diagnosis not present

## 2019-05-25 DIAGNOSIS — M5412 Radiculopathy, cervical region: Secondary | ICD-10-CM | POA: Diagnosis not present

## 2019-06-03 DIAGNOSIS — M5412 Radiculopathy, cervical region: Secondary | ICD-10-CM | POA: Diagnosis not present

## 2019-06-03 DIAGNOSIS — M5416 Radiculopathy, lumbar region: Secondary | ICD-10-CM | POA: Diagnosis not present

## 2019-06-05 DIAGNOSIS — M5416 Radiculopathy, lumbar region: Secondary | ICD-10-CM | POA: Diagnosis not present

## 2019-06-05 DIAGNOSIS — M5412 Radiculopathy, cervical region: Secondary | ICD-10-CM | POA: Diagnosis not present

## 2019-06-08 DIAGNOSIS — M5416 Radiculopathy, lumbar region: Secondary | ICD-10-CM | POA: Diagnosis not present

## 2019-06-08 DIAGNOSIS — M5412 Radiculopathy, cervical region: Secondary | ICD-10-CM | POA: Diagnosis not present

## 2019-06-10 DIAGNOSIS — M5412 Radiculopathy, cervical region: Secondary | ICD-10-CM | POA: Diagnosis not present

## 2019-06-17 DIAGNOSIS — M5412 Radiculopathy, cervical region: Secondary | ICD-10-CM | POA: Diagnosis not present

## 2019-06-17 DIAGNOSIS — M5416 Radiculopathy, lumbar region: Secondary | ICD-10-CM | POA: Diagnosis not present

## 2019-06-19 DIAGNOSIS — M5416 Radiculopathy, lumbar region: Secondary | ICD-10-CM | POA: Diagnosis not present

## 2019-06-19 DIAGNOSIS — M5412 Radiculopathy, cervical region: Secondary | ICD-10-CM | POA: Diagnosis not present

## 2019-06-24 DIAGNOSIS — M5416 Radiculopathy, lumbar region: Secondary | ICD-10-CM | POA: Diagnosis not present

## 2019-06-24 DIAGNOSIS — M5412 Radiculopathy, cervical region: Secondary | ICD-10-CM | POA: Diagnosis not present

## 2019-06-30 DIAGNOSIS — M5416 Radiculopathy, lumbar region: Secondary | ICD-10-CM | POA: Diagnosis not present

## 2019-07-03 DIAGNOSIS — M5416 Radiculopathy, lumbar region: Secondary | ICD-10-CM | POA: Diagnosis not present

## 2019-07-07 DIAGNOSIS — M5416 Radiculopathy, lumbar region: Secondary | ICD-10-CM | POA: Diagnosis not present

## 2019-07-07 DIAGNOSIS — M5412 Radiculopathy, cervical region: Secondary | ICD-10-CM | POA: Diagnosis not present

## 2019-07-09 DIAGNOSIS — M5412 Radiculopathy, cervical region: Secondary | ICD-10-CM | POA: Diagnosis not present

## 2019-07-09 DIAGNOSIS — M5416 Radiculopathy, lumbar region: Secondary | ICD-10-CM | POA: Diagnosis not present

## 2019-09-05 ENCOUNTER — Other Ambulatory Visit: Payer: Self-pay | Admitting: Family Medicine

## 2019-09-05 DIAGNOSIS — E7800 Pure hypercholesterolemia, unspecified: Secondary | ICD-10-CM

## 2019-09-05 NOTE — Telephone Encounter (Signed)
Requested Prescriptions  Pending Prescriptions Disp Refills   pravastatin (PRAVACHOL) 40 MG tablet [Pharmacy Med Name: PRAVASTATIN 40MG  TABLETS] 90 tablet 1    Sig: TAKE 1 TABLET(40 MG) BY MOUTH DAILY     Cardiovascular:  Antilipid - Statins Failed - 09/05/2019  8:44 AM      Failed - Total Cholesterol in normal range and within 360 days    Cholesterol, Total  Date Value Ref Range Status  01/12/2019 216 (H) 100 - 199 mg/dL Final         Failed - LDL in normal range and within 360 days    LDL Chol Calc (NIH)  Date Value Ref Range Status  01/12/2019 133 (H) 0 - 99 mg/dL Final         Passed - HDL in normal range and within 360 days    HDL  Date Value Ref Range Status  01/12/2019 64 >39 mg/dL Final         Passed - Triglycerides in normal range and within 360 days    Triglycerides  Date Value Ref Range Status  01/12/2019 105 0 - 149 mg/dL Final         Passed - Patient is not pregnant      Passed - Valid encounter within last 12 months    Recent Outpatient Visits          7 months ago Medicare annual wellness visit, subsequent   [facility], Lilia Argue, MD   9 months ago Need for immunization against influenza   [facility], Lilia Argue, MD   1 year ago Pure hypercholesterolemia   [facility], Lilia Argue, MD   1 year ago Hyperlipidemia, unspecified hyperlipidemia type   [facility], Ranell Patrick, MD   1 year ago Medicare annual wellness visit, subsequent   [facility], [place] D, PA-C      Future Appointments            In 4 months Pamella Pert, Lilia Argue, MD [facility] [place]            naproxen (EC NAPROSYN) 500 MG EC tablet [Pharmacy Med Name: NAPROXEN DR/EC 500MG  TABLETS] 60 tablet 0    Sig: TAKE 1 TABLET(500 MG) BY MOUTH TWICE DAILY WITH FOOD OR MILK AS NEEDED     Analgesics:  NSAIDS Passed - 09/05/2019  8:44 AM      Passed - Cr in normal range and within 360 days     Creat  Date Value Ref Range Status  11/08/2015 0.71 0.50 - 1.05 mg/dL Final    Comment:      For patients > or = 59 years of age: The upper reference limit for Creatinine is approximately 13% higher for people identified as African-American.      Creatinine, Ser  Date Value Ref Range Status  04/08/2019 0.64 0.44 - 1.00 mg/dL Final         Passed - HGB in normal range and within 360 days    Hemoglobin  Date Value Ref Range Status  04/08/2019 12.1 12.0 - 15.0 g/dL Final  11/19/2017 13.5 11.1 - 15.9 g/dL Final         Passed - Patient is not pregnant      Passed - Valid encounter within last 12 months    Recent Outpatient Visits          7 months ago Medicare annual wellness  visit, subsequent   [facility], Meda Coffee, MD   9 months ago Need for immunization against influenza   [facility], Meda Coffee, MD   1 year ago Pure hypercholesterolemia   [facility], Meda Coffee, MD   1 year ago Hyperlipidemia, unspecified hyperlipidemia type   [facility], Asencion Partridge, MD   1 year ago Medicare annual wellness visit, subsequent   [facility] at [facility], Gerald Stabs, PA-C      Future Appointments            In 4 months Leretha Pol, Meda Coffee, MD [facility] at [place], [facility]

## 2019-10-07 DIAGNOSIS — Z20822 Contact with and (suspected) exposure to covid-19: Secondary | ICD-10-CM | POA: Diagnosis not present

## 2020-01-13 ENCOUNTER — Ambulatory Visit: Payer: Medicare Other | Admitting: Family Medicine

## 2020-01-18 ENCOUNTER — Ambulatory Visit: Payer: Medicare Other | Admitting: Family Medicine

## 2020-01-19 ENCOUNTER — Encounter: Payer: Medicare Other | Admitting: Family Medicine

## 2020-02-02 ENCOUNTER — Other Ambulatory Visit: Payer: Self-pay

## 2020-02-02 ENCOUNTER — Other Ambulatory Visit: Payer: Self-pay | Admitting: Family Medicine

## 2020-02-02 ENCOUNTER — Ambulatory Visit
Admission: RE | Admit: 2020-02-02 | Discharge: 2020-02-02 | Disposition: A | Discharge status: Home / Self Care | Payer: Medicare Other | Source: Ambulatory Visit | Attending: Family Medicine | Admitting: Family Medicine

## 2020-02-02 DIAGNOSIS — E7800 Pure hypercholesterolemia, unspecified: Secondary | ICD-10-CM | POA: Diagnosis not present

## 2020-02-02 DIAGNOSIS — H53031 Strabismic amblyopia, right eye: Secondary | ICD-10-CM | POA: Diagnosis not present

## 2020-02-02 DIAGNOSIS — R0781 Pleurodynia: Secondary | ICD-10-CM

## 2020-02-02 DIAGNOSIS — M940 Chondrocostal junction syndrome [Tietze]: Secondary | ICD-10-CM

## 2020-02-02 DIAGNOSIS — Z23 Encounter for immunization: Secondary | ICD-10-CM | POA: Diagnosis not present

## 2020-02-02 DIAGNOSIS — Z0000 Encounter for general adult medical examination without abnormal findings: Secondary | ICD-10-CM | POA: Diagnosis not present

## 2020-03-23 DIAGNOSIS — Z23 Encounter for immunization: Secondary | ICD-10-CM | POA: Diagnosis not present

## 2020-04-08 DIAGNOSIS — Z1231 Encounter for screening mammogram for malignant neoplasm of breast: Secondary | ICD-10-CM | POA: Diagnosis not present

## 2020-04-08 DIAGNOSIS — Z01411 Encounter for gynecological examination (general) (routine) with abnormal findings: Secondary | ICD-10-CM | POA: Diagnosis not present

## 2020-04-08 DIAGNOSIS — Z01419 Encounter for gynecological examination (general) (routine) without abnormal findings: Secondary | ICD-10-CM | POA: Diagnosis not present

## 2020-04-08 DIAGNOSIS — Z124 Encounter for screening for malignant neoplasm of cervix: Secondary | ICD-10-CM | POA: Diagnosis not present

## 2020-05-05 DIAGNOSIS — E7800 Pure hypercholesterolemia, unspecified: Secondary | ICD-10-CM | POA: Diagnosis not present

## 2020-05-06 DIAGNOSIS — E7800 Pure hypercholesterolemia, unspecified: Secondary | ICD-10-CM | POA: Diagnosis not present

## 2020-05-06 DIAGNOSIS — R030 Elevated blood-pressure reading, without diagnosis of hypertension: Secondary | ICD-10-CM | POA: Diagnosis not present

## 2020-05-06 DIAGNOSIS — Z7185 Encounter for immunization safety counseling: Secondary | ICD-10-CM | POA: Diagnosis not present

## 2020-05-14 DIAGNOSIS — Z20822 Contact with and (suspected) exposure to covid-19: Secondary | ICD-10-CM | POA: Diagnosis not present

## 2020-05-14 DIAGNOSIS — Z03818 Encounter for observation for suspected exposure to other biological agents ruled out: Secondary | ICD-10-CM | POA: Diagnosis not present

## 2020-07-21 DIAGNOSIS — H2513 Age-related nuclear cataract, bilateral: Secondary | ICD-10-CM | POA: Diagnosis not present

## 2020-07-21 DIAGNOSIS — H53001 Unspecified amblyopia, right eye: Secondary | ICD-10-CM | POA: Diagnosis not present

## 2020-07-21 DIAGNOSIS — H353121 Nonexudative age-related macular degeneration, left eye, early dry stage: Secondary | ICD-10-CM | POA: Diagnosis not present

## 2020-12-15 IMAGING — DX DG HIP (WITH OR WITHOUT PELVIS) 2-3V*L*
3 series · 3 of 3 positions shown · non-contrast
Comparison: None.

CLINICAL DATA: Fall

EXAM:
DG HIP (WITH OR WITHOUT PELVIS) 2-3V LEFT

[pelvis ap]
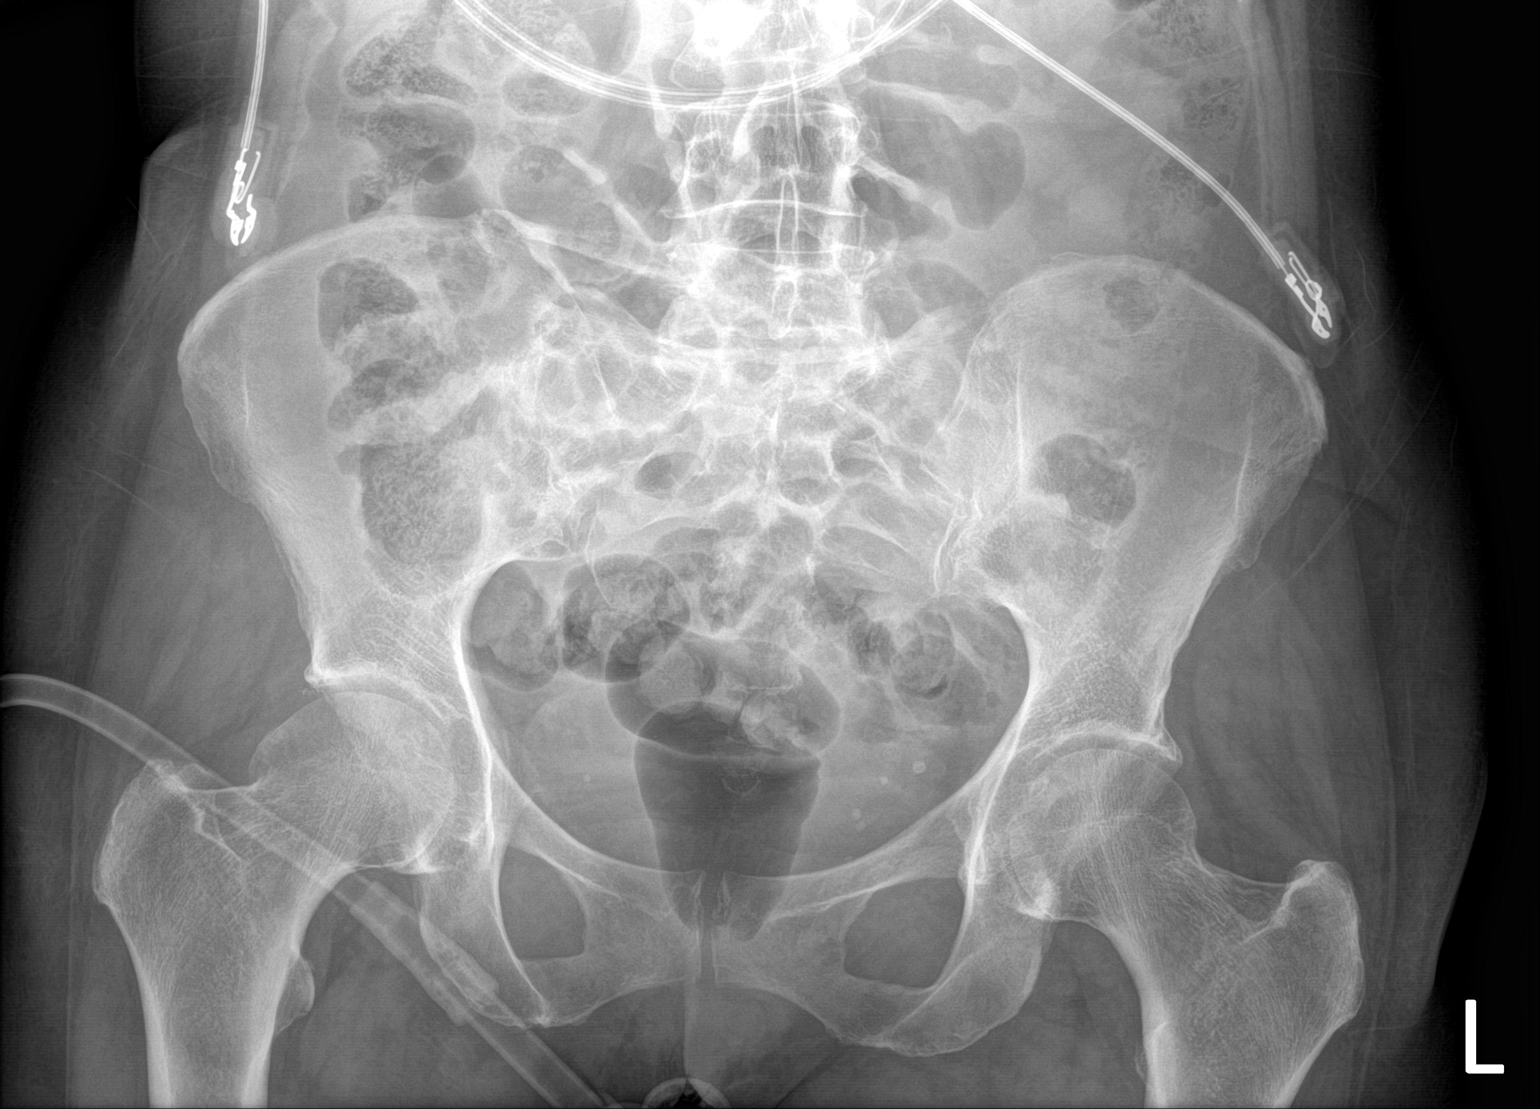

[hip ap]
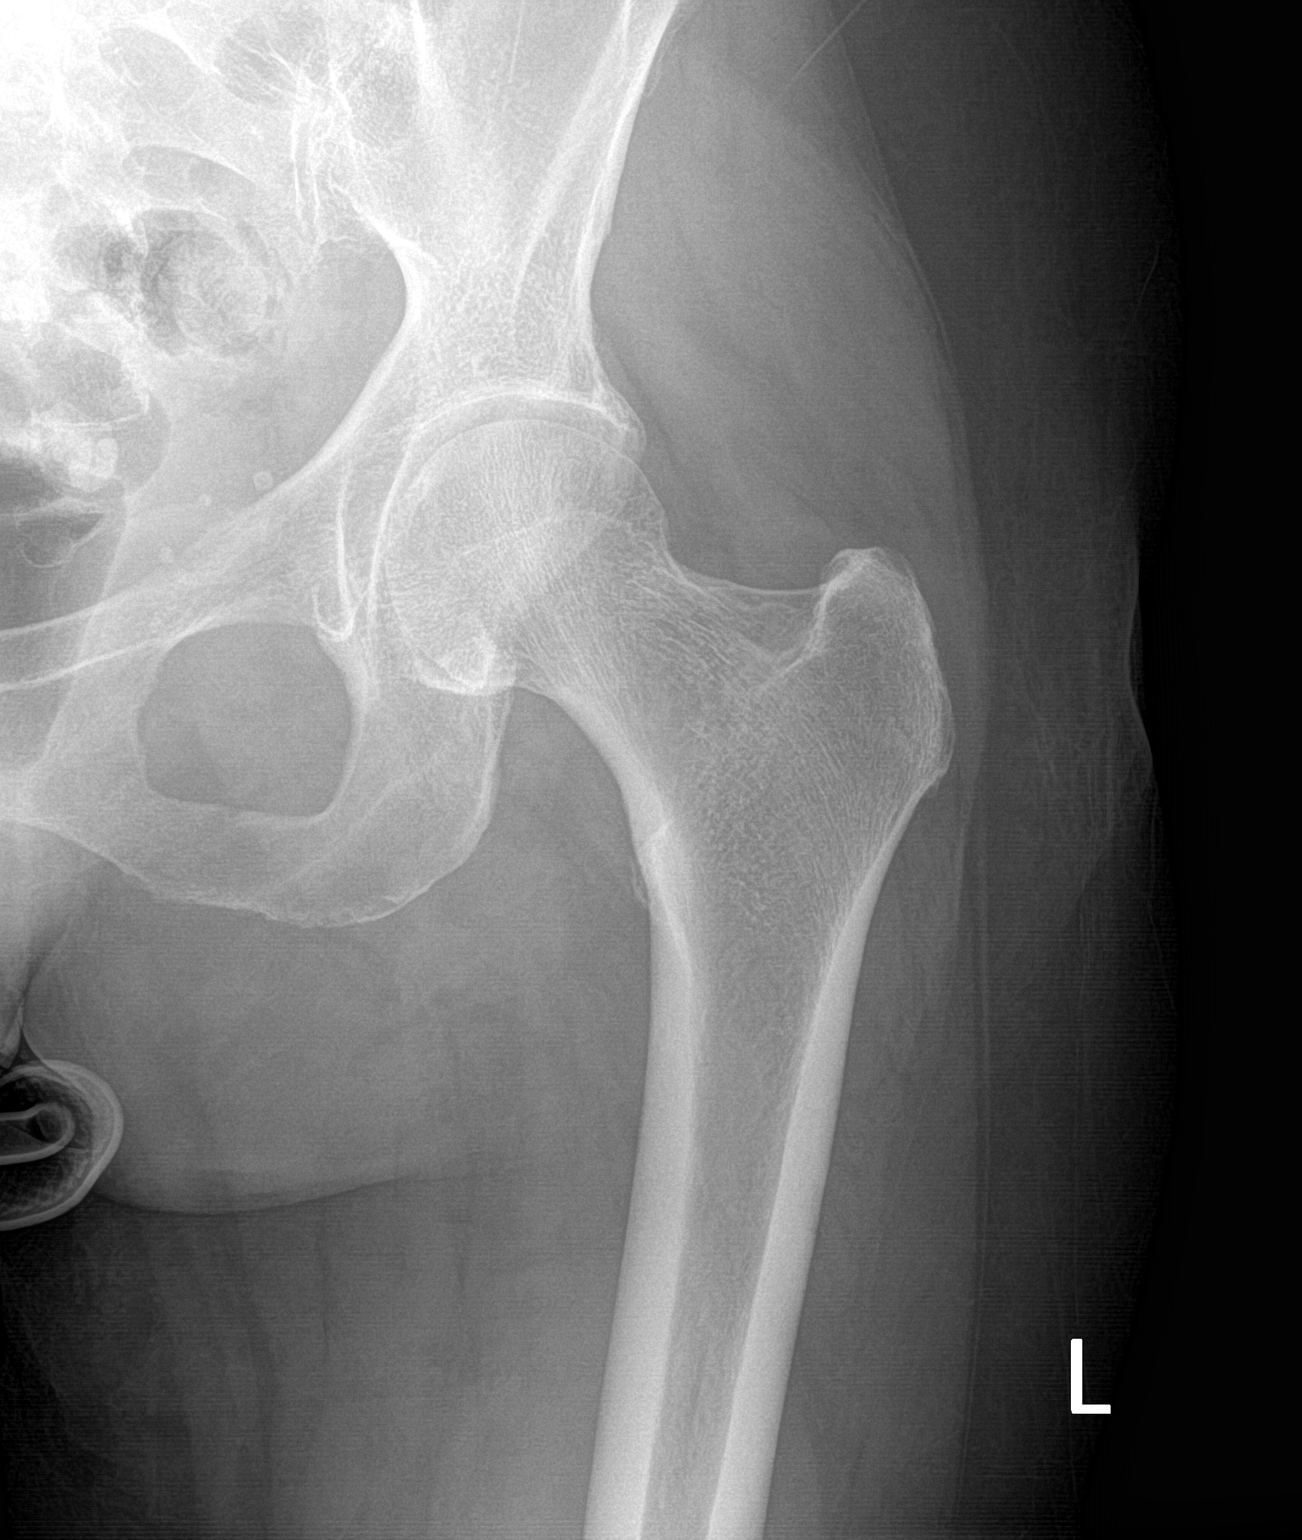

[hip lat]
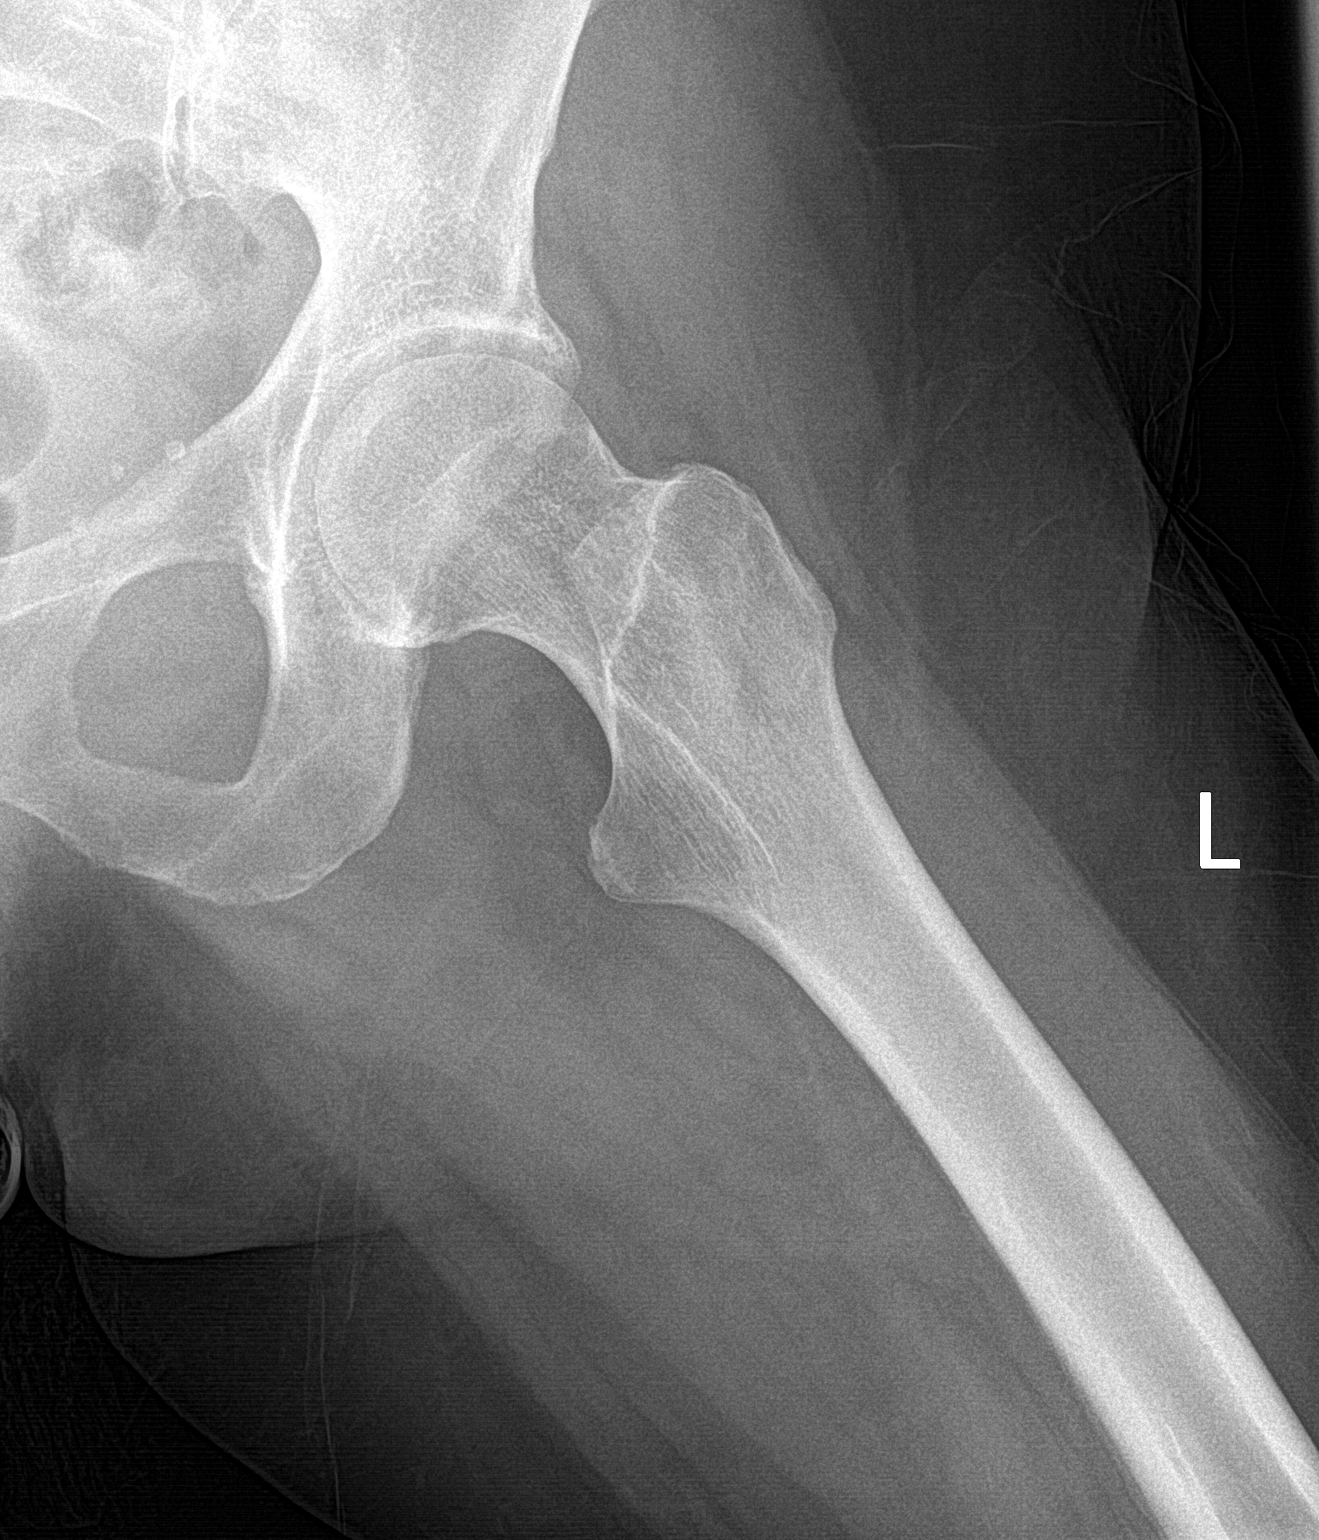

[3 of 3 positions shown; findings below may reference images not displayed]

FINDINGS: Alignment is anatomic. There is no acute fracture. Joint spaces
preserved.
IMPRESSION: No acute fracture.

## 2020-12-15 IMAGING — DX DG CHEST 1V PORT
1 series · 1 of 1 positions shown · non-contrast
Comparison: None.

CLINICAL DATA: Syncope.  Found on floor

EXAM:
PORTABLE CHEST 1 VIEW

[chest]
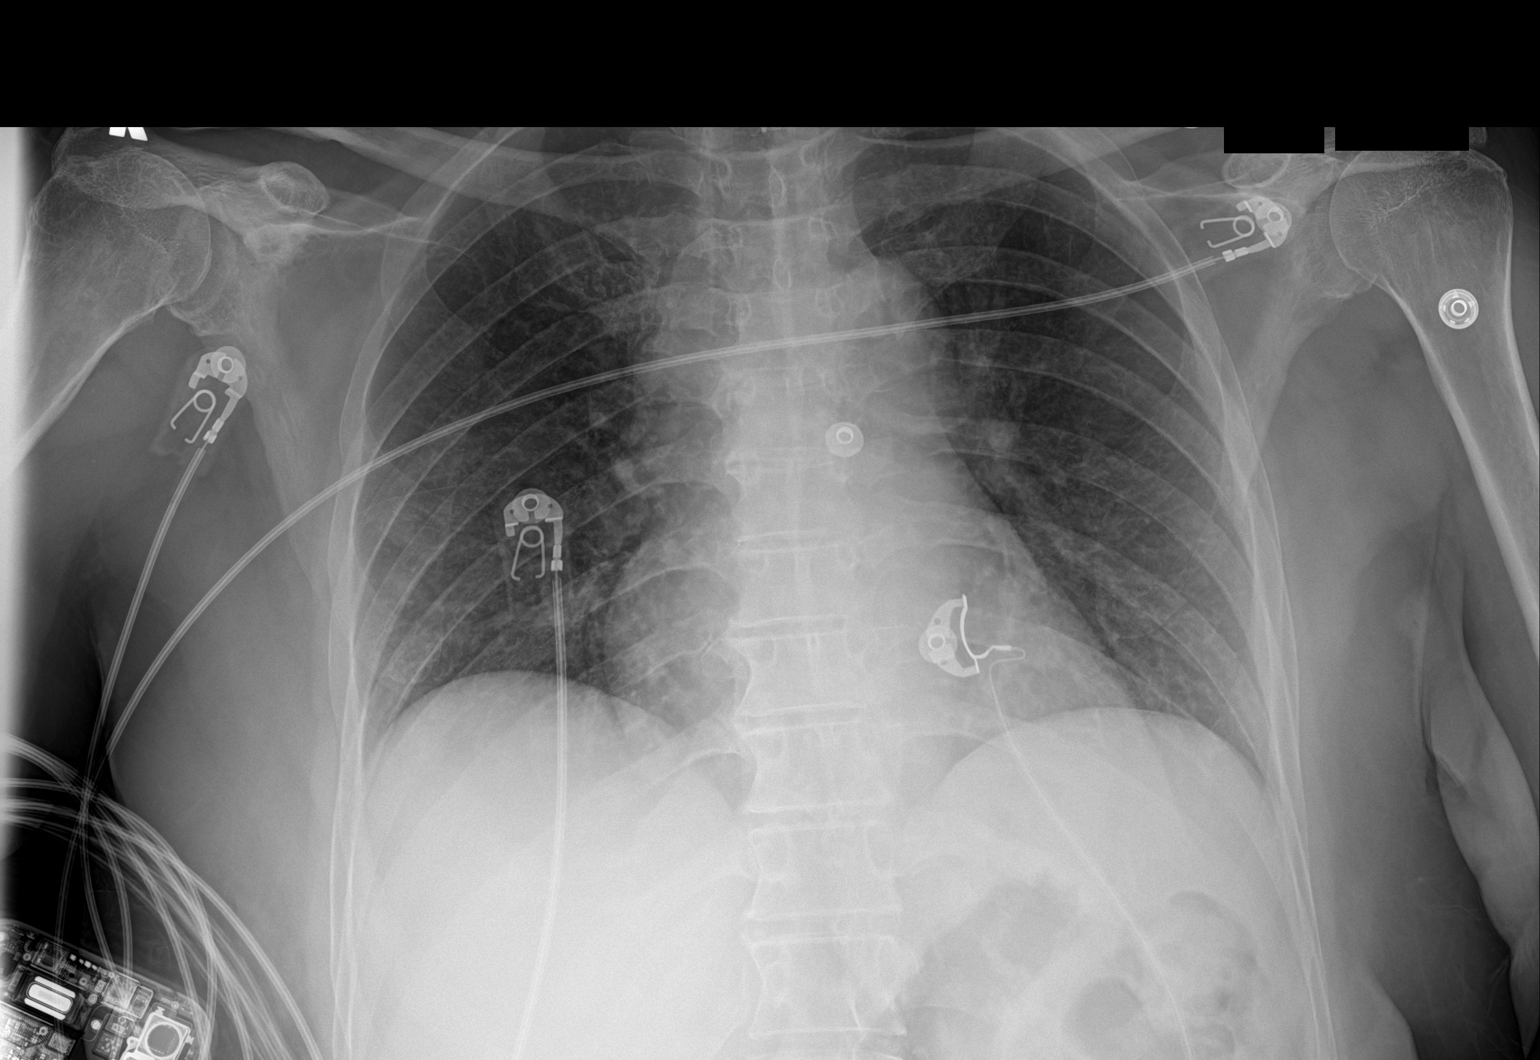

[1 of 1 positions shown; findings below may reference images not displayed]

FINDINGS: The heart size and mediastinal contours are within normal limits.
Both lungs are clear. The visualized skeletal structures are
unremarkable.
IMPRESSION: No active disease.

## 2020-12-30 DIAGNOSIS — Z23 Encounter for immunization: Secondary | ICD-10-CM | POA: Diagnosis not present

## 2021-02-07 DIAGNOSIS — E7800 Pure hypercholesterolemia, unspecified: Secondary | ICD-10-CM | POA: Diagnosis not present

## 2021-02-07 DIAGNOSIS — G8929 Other chronic pain: Secondary | ICD-10-CM | POA: Diagnosis not present

## 2021-02-07 DIAGNOSIS — Z0000 Encounter for general adult medical examination without abnormal findings: Secondary | ICD-10-CM | POA: Diagnosis not present

## 2021-02-07 DIAGNOSIS — Z131 Encounter for screening for diabetes mellitus: Secondary | ICD-10-CM | POA: Diagnosis not present

## 2021-02-07 DIAGNOSIS — Z79899 Other long term (current) drug therapy: Secondary | ICD-10-CM | POA: Diagnosis not present

## 2021-05-11 DIAGNOSIS — E7800 Pure hypercholesterolemia, unspecified: Secondary | ICD-10-CM | POA: Diagnosis not present

## 2021-05-11 DIAGNOSIS — Z79899 Other long term (current) drug therapy: Secondary | ICD-10-CM | POA: Diagnosis not present

## 2021-05-12 DIAGNOSIS — M79671 Pain in right foot: Secondary | ICD-10-CM | POA: Diagnosis not present

## 2021-05-12 DIAGNOSIS — M25571 Pain in right ankle and joints of right foot: Secondary | ICD-10-CM | POA: Diagnosis not present

## 2021-05-29 ENCOUNTER — Other Ambulatory Visit: Payer: Self-pay | Admitting: Obstetrics & Gynecology

## 2021-05-29 DIAGNOSIS — Z113 Encounter for screening for infections with a predominantly sexual mode of transmission: Secondary | ICD-10-CM | POA: Diagnosis not present

## 2021-05-29 DIAGNOSIS — Z01419 Encounter for gynecological examination (general) (routine) without abnormal findings: Secondary | ICD-10-CM | POA: Diagnosis not present

## 2021-05-29 DIAGNOSIS — Z124 Encounter for screening for malignant neoplasm of cervix: Secondary | ICD-10-CM | POA: Diagnosis not present

## 2021-05-29 DIAGNOSIS — E2839 Other primary ovarian failure: Secondary | ICD-10-CM | POA: Diagnosis not present

## 2021-05-29 DIAGNOSIS — Z01411 Encounter for gynecological examination (general) (routine) with abnormal findings: Secondary | ICD-10-CM | POA: Diagnosis not present

## 2021-05-29 DIAGNOSIS — Z1231 Encounter for screening mammogram for malignant neoplasm of breast: Secondary | ICD-10-CM | POA: Diagnosis not present

## 2021-05-29 DIAGNOSIS — Z6824 Body mass index (BMI) 24.0-24.9, adult: Secondary | ICD-10-CM | POA: Diagnosis not present

## 2021-07-24 DIAGNOSIS — H2513 Age-related nuclear cataract, bilateral: Secondary | ICD-10-CM | POA: Diagnosis not present

## 2021-07-24 DIAGNOSIS — H353121 Nonexudative age-related macular degeneration, left eye, early dry stage: Secondary | ICD-10-CM | POA: Diagnosis not present

## 2021-07-24 DIAGNOSIS — H53001 Unspecified amblyopia, right eye: Secondary | ICD-10-CM | POA: Diagnosis not present

## 2021-10-11 IMAGING — CR DG RIBS 2V*R*
2 series · 2 of 2 positions shown · non-contrast
Comparison: Chest x-ray today

CLINICAL DATA: Right side rib pain. Costochondritis. No known
injury.

EXAM:
RIGHT RIBS - 2 VIEW

[w ribs ap lower right]
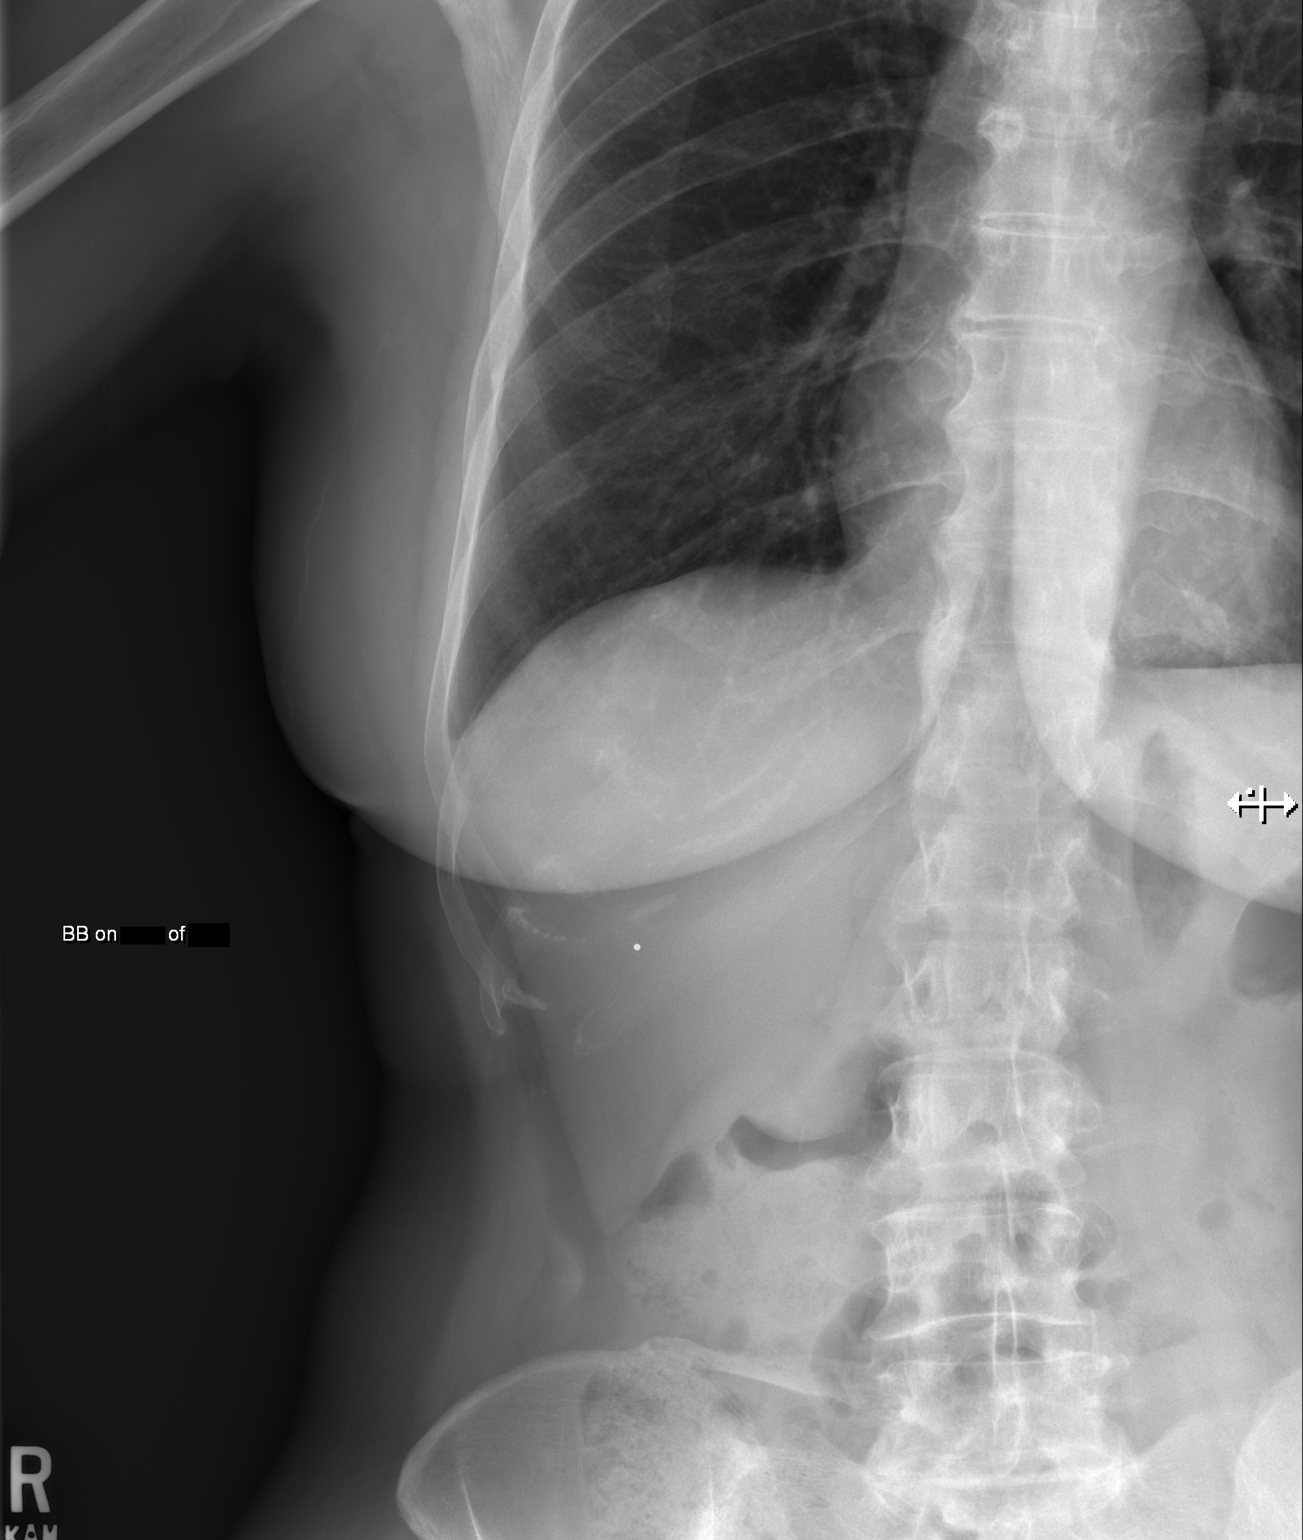

[w ribs obl right]
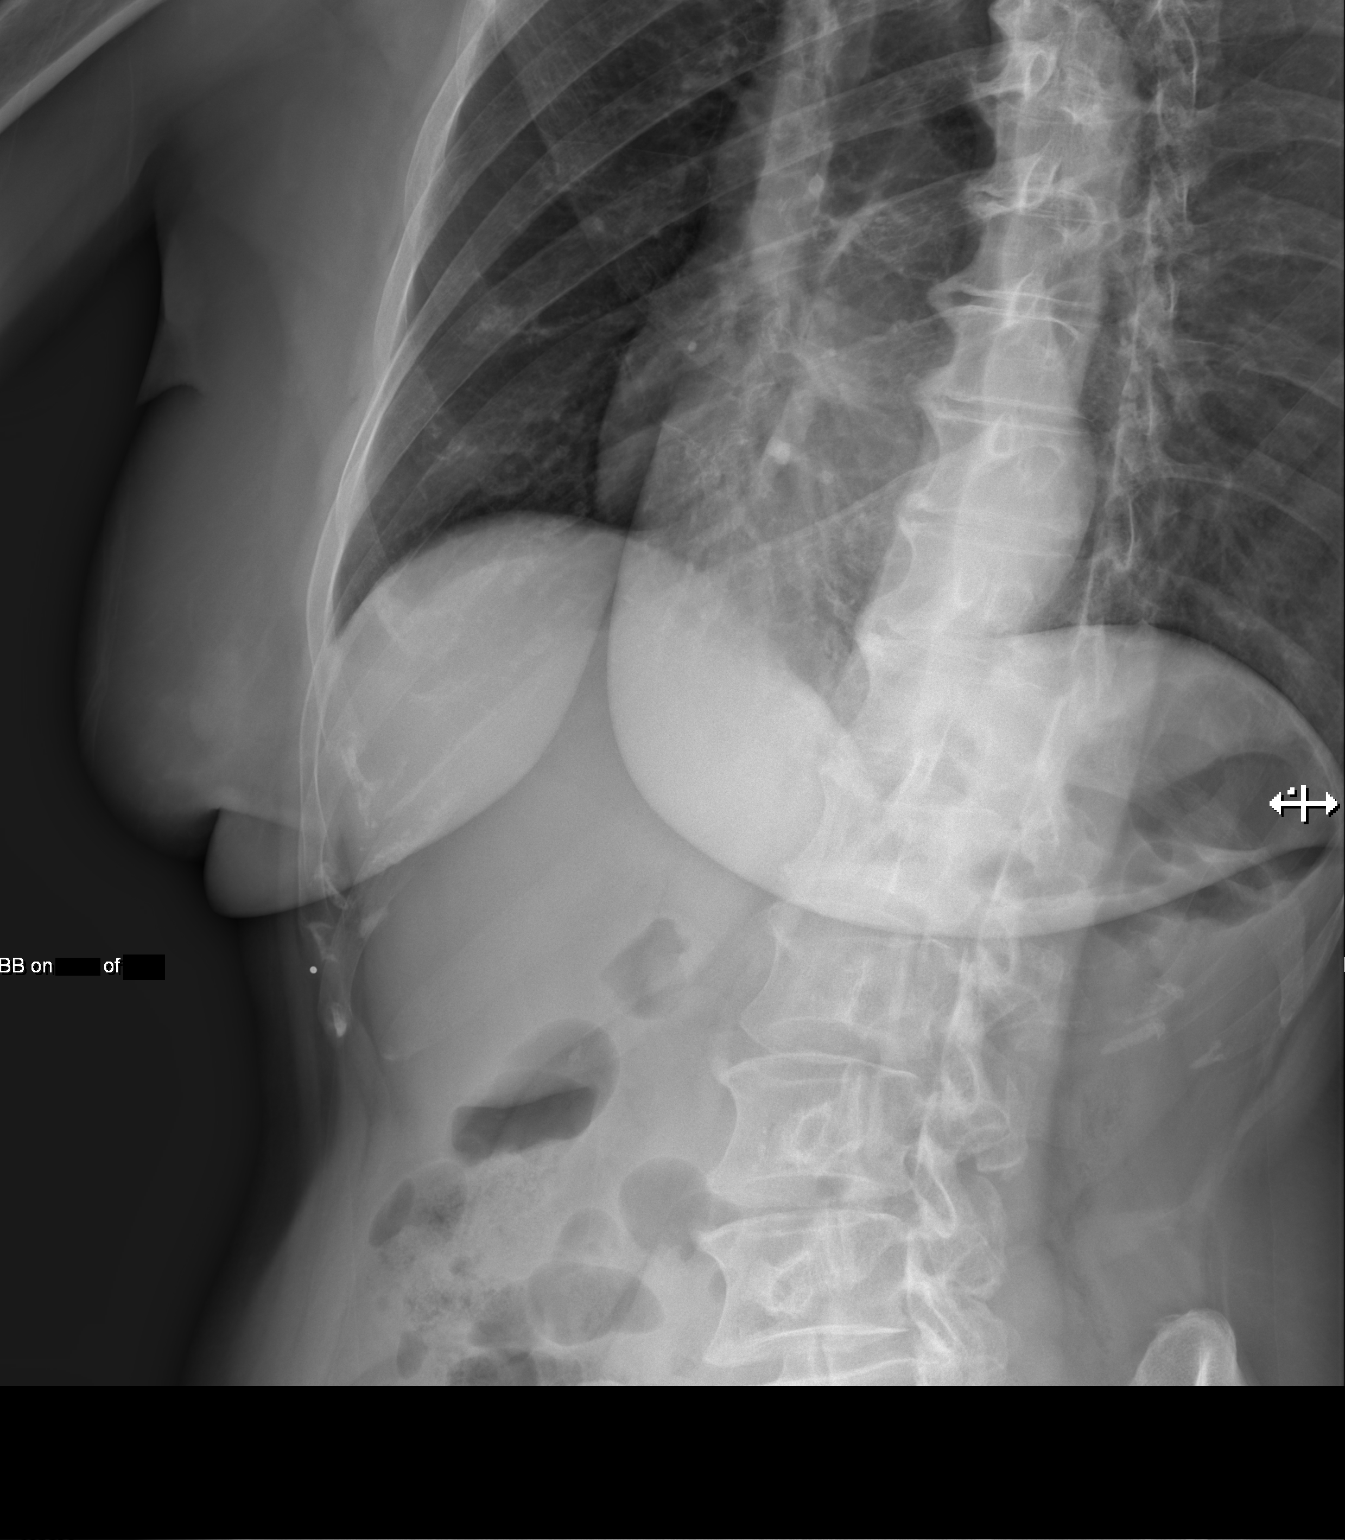

[2 of 2 positions shown; findings below may reference images not displayed]

FINDINGS: No fracture or other bone lesions are seen involving the ribs.
IMPRESSION: Negative.

## 2021-11-15 ENCOUNTER — Ambulatory Visit
Admission: RE | Admit: 2021-11-15 | Discharge: 2021-11-15 | Disposition: A | Discharge status: Home / Self Care | Payer: Medicare Other | Source: Ambulatory Visit | Attending: Obstetrics & Gynecology | Admitting: Obstetrics & Gynecology

## 2021-11-15 DIAGNOSIS — E2839 Other primary ovarian failure: Secondary | ICD-10-CM

## 2021-11-15 DIAGNOSIS — Z780 Asymptomatic menopausal state: Secondary | ICD-10-CM | POA: Diagnosis not present

## 2021-12-06 DIAGNOSIS — Z23 Encounter for immunization: Secondary | ICD-10-CM | POA: Diagnosis not present

## 2022-02-22 DIAGNOSIS — E7800 Pure hypercholesterolemia, unspecified: Secondary | ICD-10-CM | POA: Diagnosis not present

## 2022-02-22 DIAGNOSIS — Z131 Encounter for screening for diabetes mellitus: Secondary | ICD-10-CM | POA: Diagnosis not present

## 2022-02-22 DIAGNOSIS — Z79899 Other long term (current) drug therapy: Secondary | ICD-10-CM | POA: Diagnosis not present

## 2022-02-22 DIAGNOSIS — Z0000 Encounter for general adult medical examination without abnormal findings: Secondary | ICD-10-CM | POA: Diagnosis not present

## 2022-02-22 DIAGNOSIS — J329 Chronic sinusitis, unspecified: Secondary | ICD-10-CM | POA: Diagnosis not present

## 2022-05-01 DIAGNOSIS — J019 Acute sinusitis, unspecified: Secondary | ICD-10-CM | POA: Diagnosis not present

## 2022-06-05 DIAGNOSIS — Z01419 Encounter for gynecological examination (general) (routine) without abnormal findings: Secondary | ICD-10-CM | POA: Diagnosis not present

## 2022-06-05 DIAGNOSIS — Z124 Encounter for screening for malignant neoplasm of cervix: Secondary | ICD-10-CM | POA: Diagnosis not present

## 2022-06-05 DIAGNOSIS — Z01411 Encounter for gynecological examination (general) (routine) with abnormal findings: Secondary | ICD-10-CM | POA: Diagnosis not present

## 2022-06-21 DIAGNOSIS — M25561 Pain in right knee: Secondary | ICD-10-CM | POA: Diagnosis not present

## 2022-06-22 DIAGNOSIS — M25561 Pain in right knee: Secondary | ICD-10-CM | POA: Diagnosis not present

## 2022-07-05 DIAGNOSIS — M25561 Pain in right knee: Secondary | ICD-10-CM | POA: Diagnosis not present

## 2022-11-06 DIAGNOSIS — H353121 Nonexudative age-related macular degeneration, left eye, early dry stage: Secondary | ICD-10-CM | POA: Diagnosis not present

## 2022-11-06 DIAGNOSIS — H53001 Unspecified amblyopia, right eye: Secondary | ICD-10-CM | POA: Diagnosis not present

## 2022-11-06 DIAGNOSIS — H2513 Age-related nuclear cataract, bilateral: Secondary | ICD-10-CM | POA: Diagnosis not present

## 2022-12-04 DIAGNOSIS — L989 Disorder of the skin and subcutaneous tissue, unspecified: Secondary | ICD-10-CM | POA: Diagnosis not present

## 2023-01-01 DIAGNOSIS — Z23 Encounter for immunization: Secondary | ICD-10-CM | POA: Diagnosis not present

## 2023-02-25 DIAGNOSIS — L820 Inflamed seborrheic keratosis: Secondary | ICD-10-CM | POA: Diagnosis not present

## 2023-02-25 DIAGNOSIS — D2239 Melanocytic nevi of other parts of face: Secondary | ICD-10-CM | POA: Diagnosis not present

## 2023-02-26 DIAGNOSIS — Z0000 Encounter for general adult medical examination without abnormal findings: Secondary | ICD-10-CM | POA: Diagnosis not present

## 2023-02-26 DIAGNOSIS — E7800 Pure hypercholesterolemia, unspecified: Secondary | ICD-10-CM | POA: Diagnosis not present

## 2023-03-04 DIAGNOSIS — Z0000 Encounter for general adult medical examination without abnormal findings: Secondary | ICD-10-CM | POA: Diagnosis not present

## 2023-03-04 DIAGNOSIS — E7800 Pure hypercholesterolemia, unspecified: Secondary | ICD-10-CM | POA: Diagnosis not present

## 2023-03-04 DIAGNOSIS — Z79899 Other long term (current) drug therapy: Secondary | ICD-10-CM | POA: Diagnosis not present

## 2023-03-04 DIAGNOSIS — Z23 Encounter for immunization: Secondary | ICD-10-CM | POA: Diagnosis not present

## 2023-04-19 DIAGNOSIS — J0110 Acute frontal sinusitis, unspecified: Secondary | ICD-10-CM | POA: Diagnosis not present

## 2023-05-13 DIAGNOSIS — Z6825 Body mass index (BMI) 25.0-25.9, adult: Secondary | ICD-10-CM | POA: Diagnosis not present

## 2023-05-13 DIAGNOSIS — J0141 Acute recurrent pansinusitis: Secondary | ICD-10-CM | POA: Diagnosis not present

## 2023-06-03 DIAGNOSIS — E7800 Pure hypercholesterolemia, unspecified: Secondary | ICD-10-CM | POA: Diagnosis not present

## 2023-06-10 DIAGNOSIS — Z124 Encounter for screening for malignant neoplasm of cervix: Secondary | ICD-10-CM | POA: Diagnosis not present

## 2023-06-10 DIAGNOSIS — Z1331 Encounter for screening for depression: Secondary | ICD-10-CM | POA: Diagnosis not present

## 2023-06-10 DIAGNOSIS — Z1231 Encounter for screening mammogram for malignant neoplasm of breast: Secondary | ICD-10-CM | POA: Diagnosis not present

## 2023-11-14 DIAGNOSIS — H53001 Unspecified amblyopia, right eye: Secondary | ICD-10-CM | POA: Diagnosis not present

## 2023-11-14 DIAGNOSIS — H353121 Nonexudative age-related macular degeneration, left eye, early dry stage: Secondary | ICD-10-CM | POA: Diagnosis not present

## 2023-11-14 DIAGNOSIS — H25813 Combined forms of age-related cataract, bilateral: Secondary | ICD-10-CM | POA: Diagnosis not present

## 2024-01-20 DIAGNOSIS — Z23 Encounter for immunization: Secondary | ICD-10-CM | POA: Diagnosis not present

## 2024-02-08 DIAGNOSIS — R1011 Right upper quadrant pain: Secondary | ICD-10-CM | POA: Diagnosis not present

## 2024-02-08 DIAGNOSIS — K5900 Constipation, unspecified: Secondary | ICD-10-CM | POA: Diagnosis not present

## 2024-02-14 DIAGNOSIS — K5900 Constipation, unspecified: Secondary | ICD-10-CM | POA: Diagnosis not present

## 2024-02-14 DIAGNOSIS — R1011 Right upper quadrant pain: Secondary | ICD-10-CM | POA: Diagnosis not present

## 2024-03-26 ENCOUNTER — Other Ambulatory Visit: Payer: Self-pay | Admitting: Nurse Practitioner

## 2024-03-26 DIAGNOSIS — R1011 Right upper quadrant pain: Secondary | ICD-10-CM

## 2024-04-02 ENCOUNTER — Ambulatory Visit
Admission: RE | Admit: 2024-04-02 | Discharge: 2024-04-02 | Disposition: A | Discharge status: Home / Self Care | Source: Ambulatory Visit | Attending: Nurse Practitioner | Admitting: Nurse Practitioner

## 2024-04-02 DIAGNOSIS — R1011 Right upper quadrant pain: Secondary | ICD-10-CM

## 2024-04-02 MED ORDER — IOPAMIDOL (ISOVUE-370) INJECTION 76%
80.0000 mL | Freq: Once | INTRAVENOUS | Status: AC | PRN
  Administered 2024-04-02: 80 mL via INTRAVENOUS
